# Patient Record
Sex: Male | Born: 1935 | Race: White | Hispanic: No | Marital: Married
Health system: Southern US, Community
[De-identification: ages and names within clinical notes are randomized; demographics above are authoritative.]

---

## 2006-11-05 ENCOUNTER — Encounter
Admission: RE | Admit: 2006-11-05 | Discharge: 2007-02-03 | Payer: Self-pay | Admitting: Physical Medicine & Rehabilitation

## 2006-11-10 ENCOUNTER — Ambulatory Visit: Payer: Self-pay | Admitting: Physical Medicine & Rehabilitation

## 2007-01-14 ENCOUNTER — Observation Stay (HOSPITAL_COMMUNITY): Admission: RE | Admit: 2007-01-14 | Discharge: 2007-01-15 | Payer: Self-pay | Admitting: Orthopaedic Surgery

## 2007-03-24 ENCOUNTER — Inpatient Hospital Stay (HOSPITAL_COMMUNITY): Admission: RE | Admit: 2007-03-24 | Discharge: 2007-03-27 | Payer: Self-pay | Admitting: Orthopaedic Surgery

## 2007-12-02 IMAGING — CR DG LUMBAR SPINE 2-3V
1 series · 1 of 1 positions shown · non-contrast
Comparison: None

CLINICAL DATA: L4-L5 spinal stenosis. Fusion revision.

LUMBAR SPINE - 2 VIEW

[view not recorded]
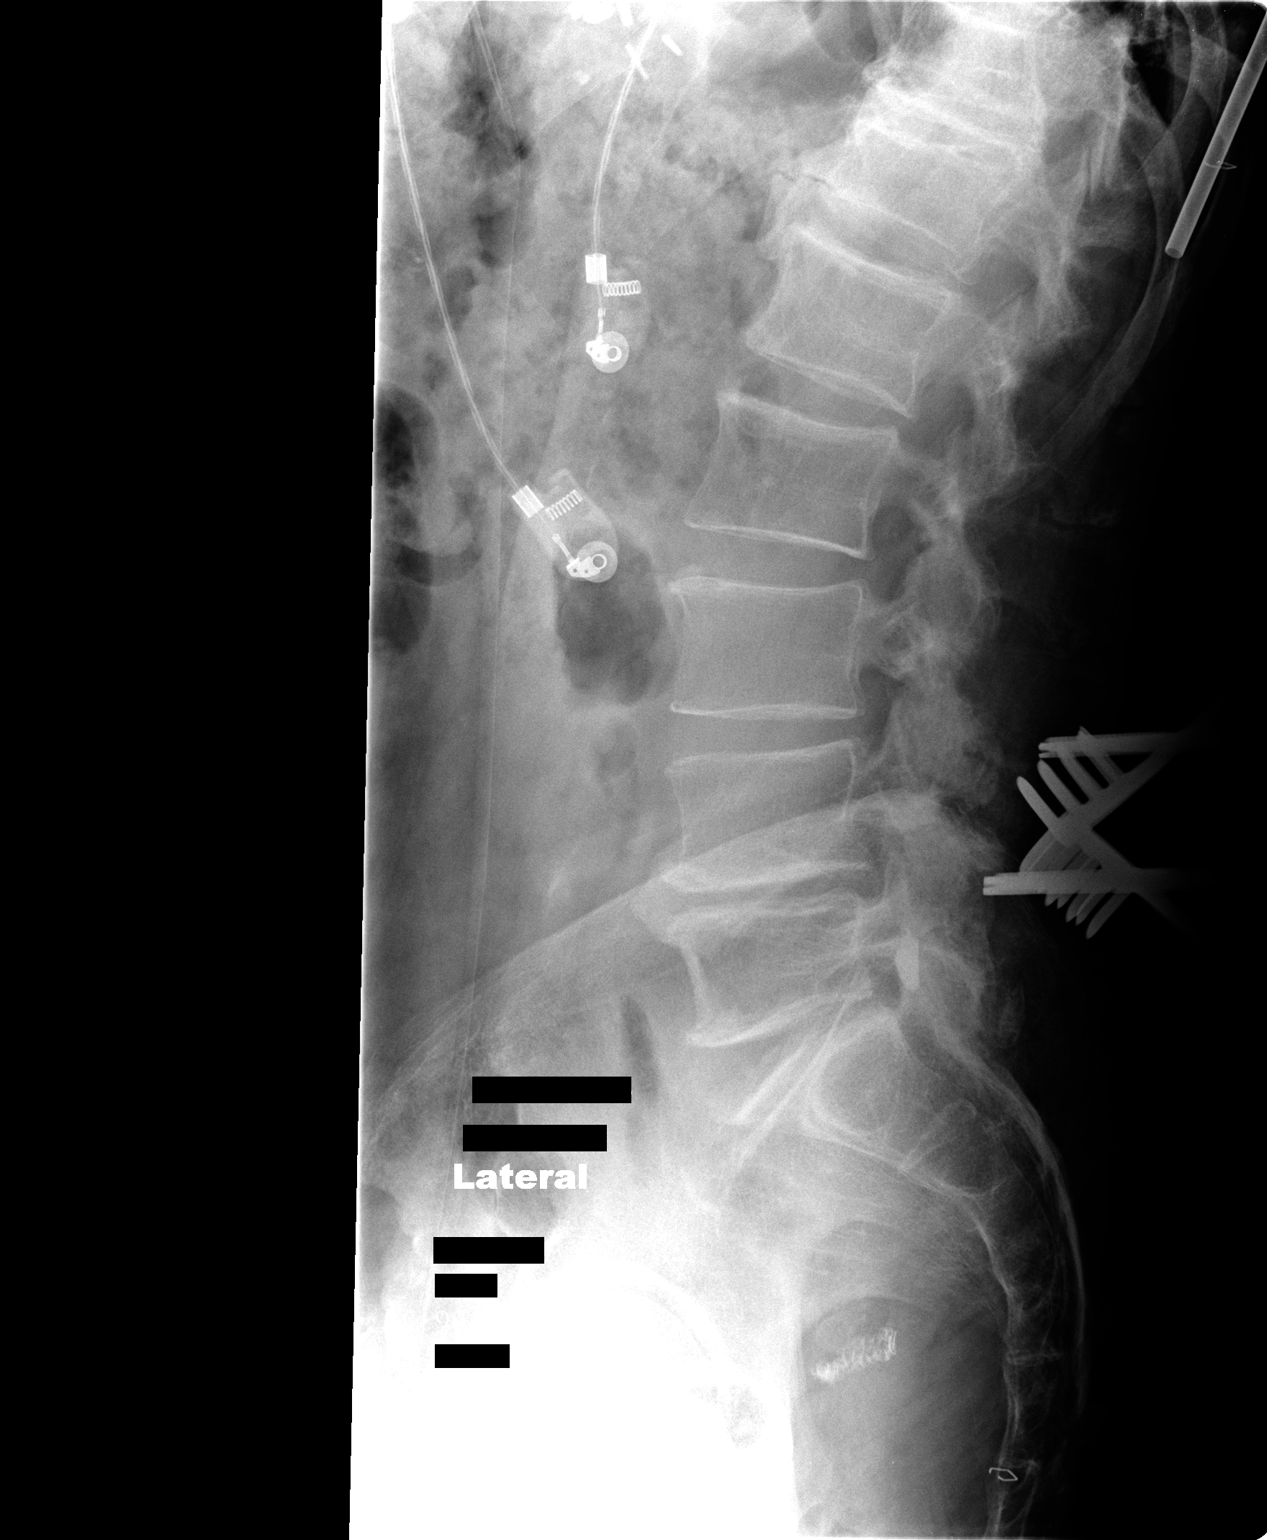

[1 of 1 positions shown; findings below may reference images not displayed]

FINDINGS: Two intraoperative views are submitted. The first, labeled 1566 hours
demonstrates surgical devices projecting posterior to the L4 and L5 levels.
Focus of increased density projecting about the posterior aspect of the L5-S1
level is of indeterminate etiology. Note is made of probable mild superior
endplate compression fracture T12, incompletely evaluated.

The second image, labeled film 2, at 1151 hours, demonstrates posterior fixation
of L4 and L5.

IMPRESSION

1. L4-L5 fixation intraoperative views as described.
2. Possible mild T12 compression fracture which is incompletely imaged.

## 2007-12-05 IMAGING — CR DG LUMBAR SPINE 2-3V
2 series · 2 of 2 positions shown · non-contrast
Comparison: 03/24/07

CLINICAL DATA: 84 year-old with spinal stenosis.
 LUMBAR SPINE ? 2 VIEW:

[view not recorded (1 of 2)]
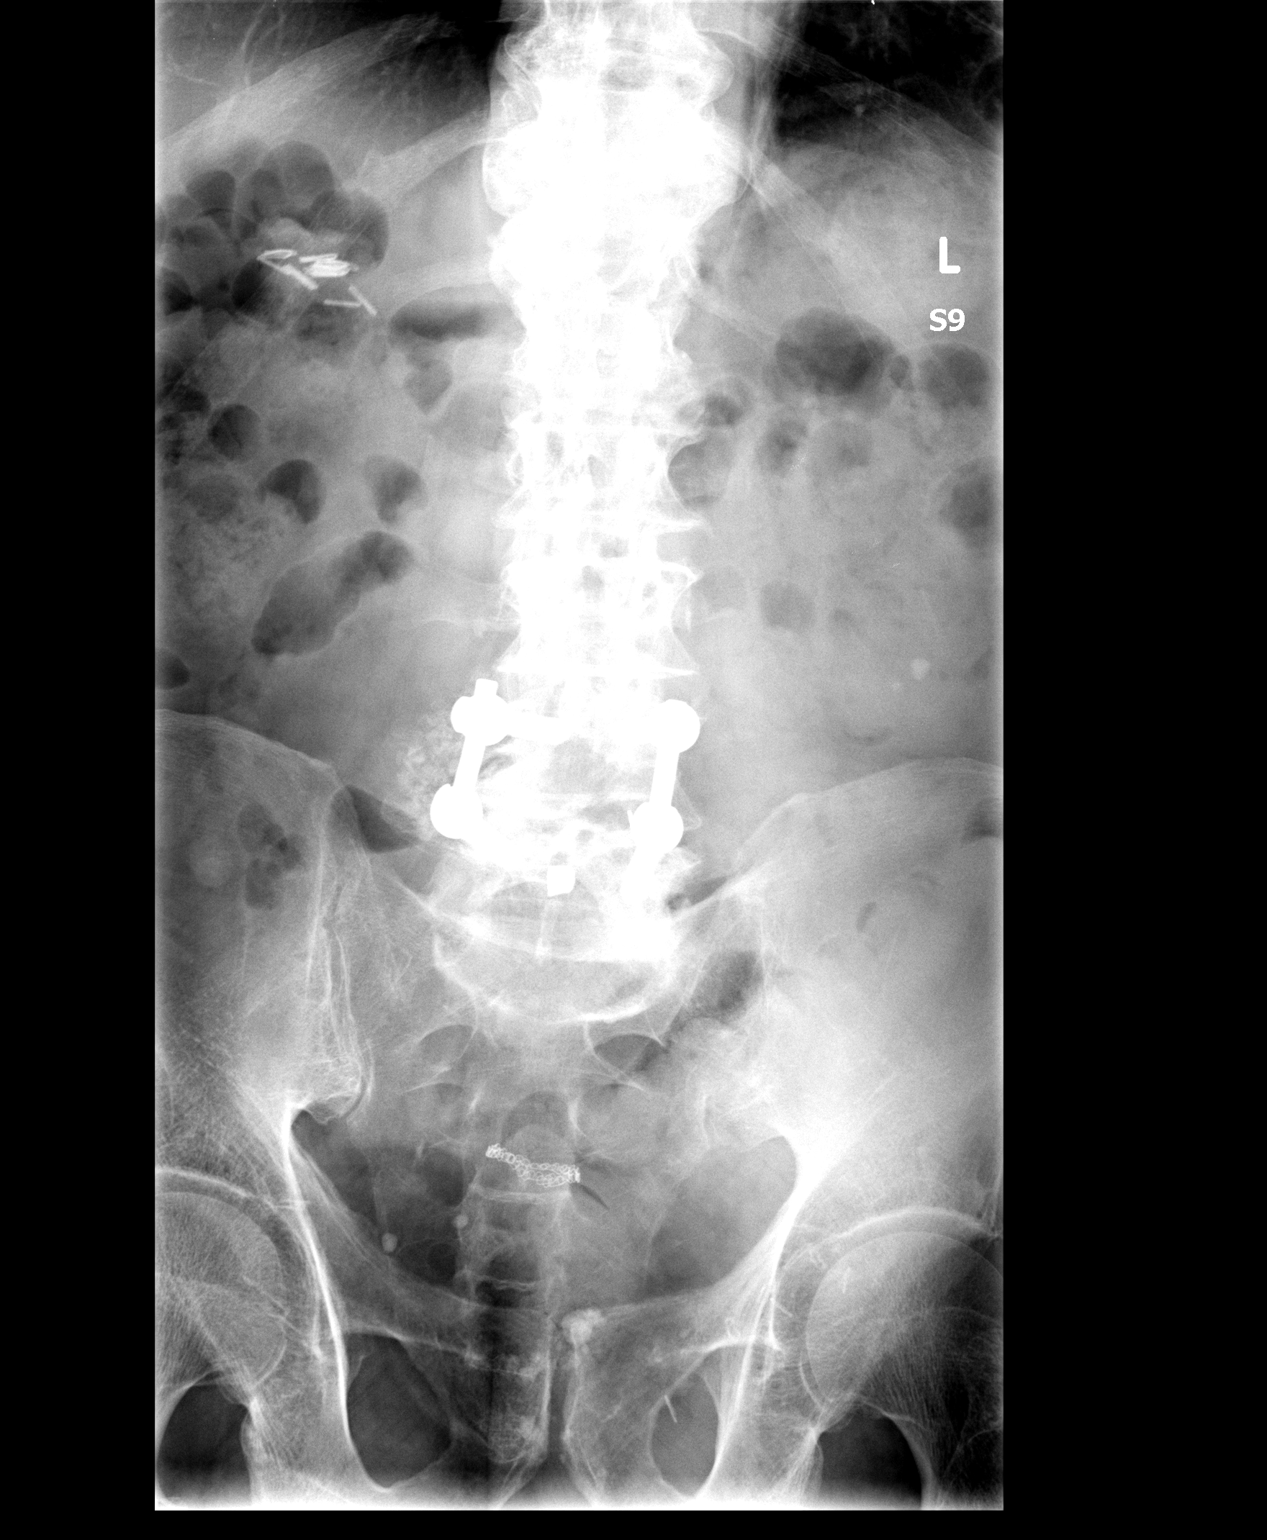

[view not recorded (2 of 2)]
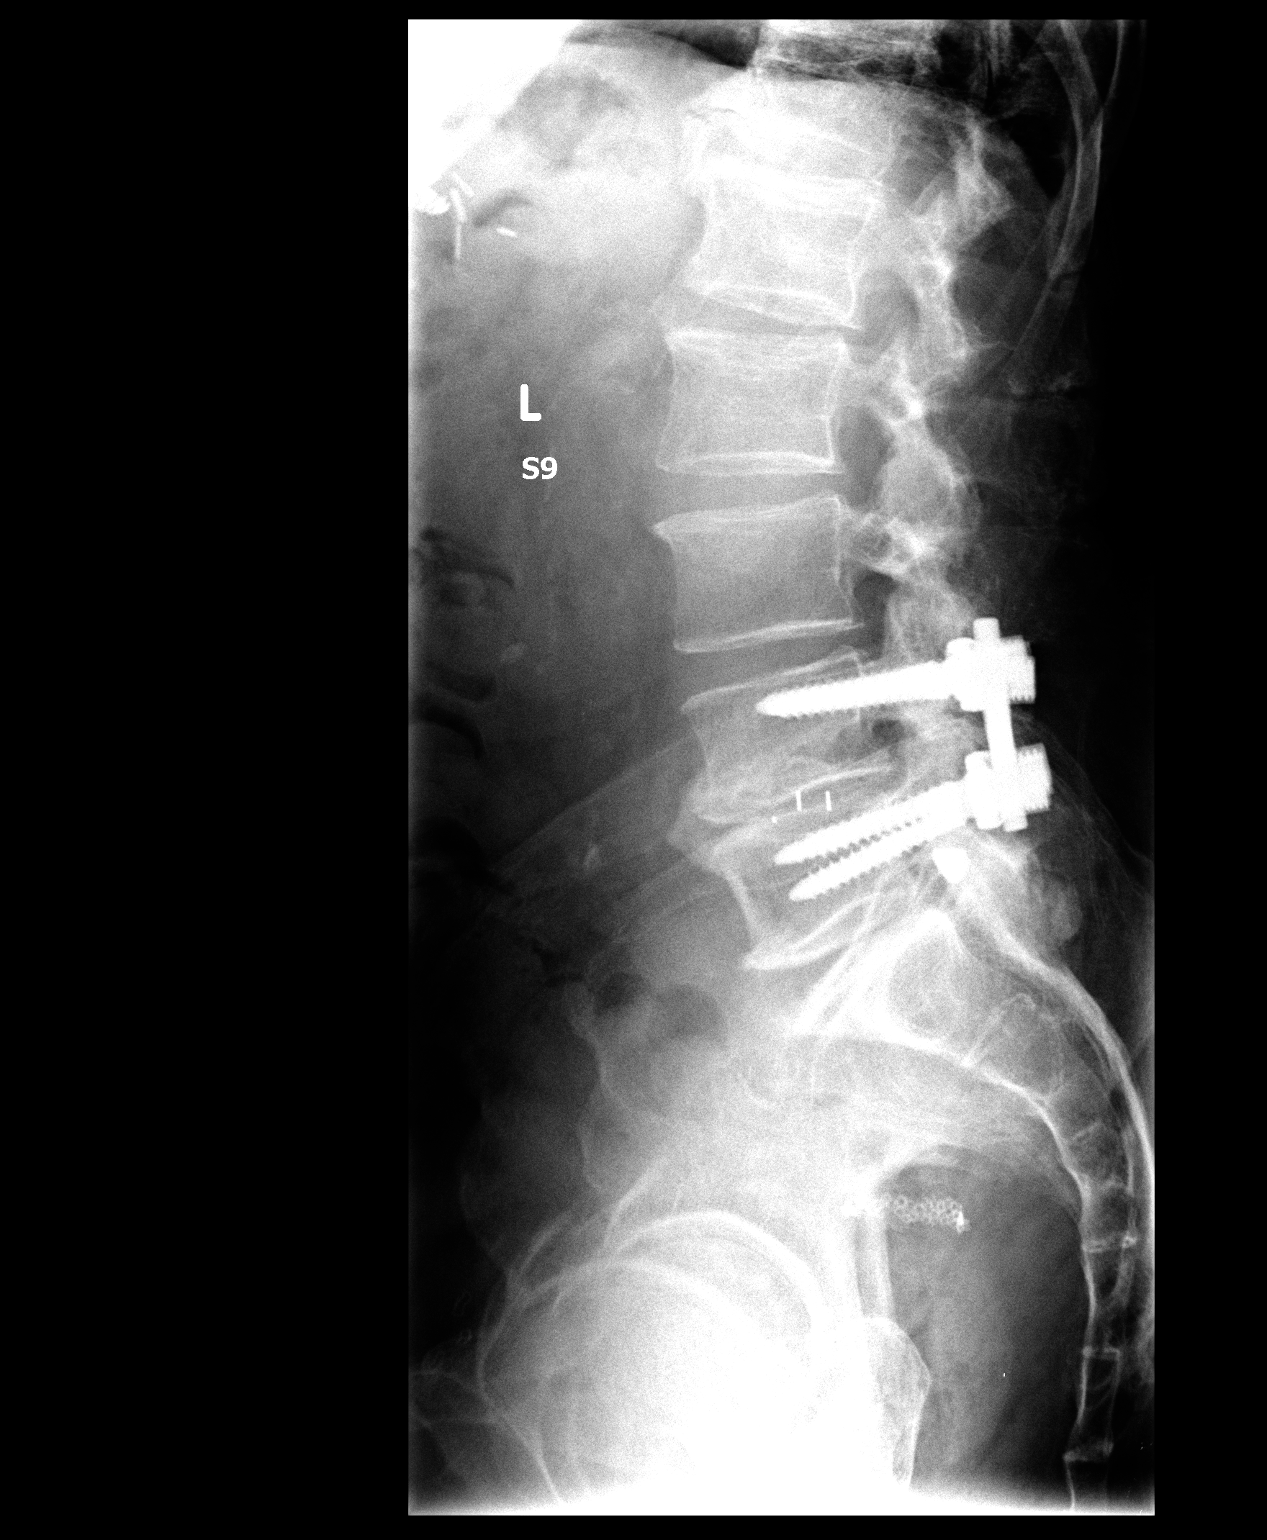

[2 of 2 positions shown; findings below may reference images not displayed]

FINDINGS: Pedicle screws and posterior rods are fusing L4-5 along with interbody bone space.  Persistent density in the spinal canal behind L5.
IMPRESSION: 1.  Stable position of the hardware and stable alignment of the lumbar spine.

## 2010-12-31 NOTE — Op Note (Signed)
NAME:  Gary Gibbs, LOUDERMILK NO.:  [id number]   MEDICAL RECORD NO.:  [id number]          PATIENT TYPE:  INP   LOCATION:  5021                         FACILITY:  [facility]   PHYSICIAN:  Sharolyn Douglas, M.D.        DATE OF BIRTH:  September 26, 1921   DATE OF PROCEDURE:  03/24/2007  DATE OF DISCHARGE:                               OPERATIVE REPORT   PREOPERATIVE DIAGNOSIS:  1. L4-5 degenerative spondylosis and disk disease with recurrent      chronic disk herniation.  2. Chronic back and left lower extremity pain.  3. Multilevel lumbar spondylosis.   POSTOPERATIVE DIAGNOSIS:  1. L4-5 degenerative spondylosis and disk disease with recurrent      chronic disk herniation.  2. Chronic back and left lower extremity pain.  3. Multilevel lumbar spondylosis.  4. Previous L5-S1 fusion.   PROCEDURE:  1. Exploration of L5-S1 posterior lateral fusion.  2. Revision L4-5 laminectomy with wide decompression of the thecal sac      and nerve roots bilaterally.  3. Transforaminal lumbar interbody fusion L4-5 with placement of 10-mm      PEEK cage.  4. Posterior spinal fusion L4-5.  5. Pedicle screw instrumentation L4-5 using the Abbott spine system.  6. Local autogenous bone graft supplemented with BMP.   SURGEON:  Sharolyn Douglas, MD.   ASSISTANT:  Aura Fey. Bobbe Medico.   ANESTHESIA:  General endotracheal.   ESTIMATED BLOOD LOSS:  300 mL.   COMPLICATIONS:  None.   SPONGE AND NEEDLE COUNT:  Correct.   INDICATIONS:  The patient is a pleasant 77 year old male with  progressively worsening back and left lower extremity pain.  He had a  history of previous lumbar surgery done many years ago including  laminectomy and fusion.  As mentioned, studies demonstrate degenerative  changes most pronounced at L4-5 with a left chronic appearing  posterolateral recurrent disk herniation.  He has failed other attempts  at conservative treatment and now elects for revision decompression and  fusion at L4-5.   Risks, benefits and alternatives were reviewed.  The  patient elected to proceed.   PROCEDURE:  After informed consent patient taken to the operating room.  He underwent general endotracheal anesthesia without difficulty given  prophylactic IV antibiotics.  Neuro monitoring was established in the  form of lower extremity EMGs and SSEP's.  He was carefully turned prone  onto the Wilson frame.  All bony prominences padded.  Face eyes  protected at all times.  Back prepped, draped usual sterile fashion.  Previous incision was utilized and extended several centimeters  proximally.  Dissection was carried sharply through the previous scar  tissue and a subperiosteal exposure was carried out to the tips of the  transverse process of L4-L5 bilaterally.  We found severe hypertrophy of  the facette joints.  In addition, there appeared to have been an in situ  fusion at L5-S1, primarily on the left side involving the facette joint  as well as the transverse processes of L5 and sacral ala.  This was  explored fully using the electrocautery and it  did appear that a solid  fusion had formed between L5-S1 on the left.  There was no visible  motion occurring at this segment.  We concluded that the L5-S1 segment  was fused.   At this point we turned our attention to performing revision laminectomy  at L4-5 the edges of the previous laminectomy were identified with  curettes.  The epidural fibrosis was carefully cleared using loupes and  headlight magnification.  Once the spinal canal could be entered the  laminectomy was widened and the lateral border of thecal sac was  identified bilaterally.  We then identified the L5 nerve roots.  On the  left side the L5 nerve root was scarred to the underlying disk and being  displaced dorsally by a ossified disk herniation.  Once we were  satisfied with the decompression, we proceeded with placement of pedicle  screw instrumentation L4 and L5 bilaterally.  Using  anatomic probing  technique, we could palpate the pedicles from within the spinal canal.  The pedicles were started with the awl.  We then used the pedicle probe  to cannulate the pedicle.  The pedicles were palpated.  There were no  breeches.  We placed 6.5 x 45 mm screws.  The bone quality was somewhat  soft, however the screw purchase was adequate.  We stimulated the  pedicle screws after they were placed in there were no deleterious  changes on the triggered EMGs.   We completed the posterior spinal fusion by decorticating the transverse  processes of L4 and L5 bilaterally and packing the local bone graft  obtained from the laminectomy into the lateral gutters.   At this point we elected to proceed the transforaminal lumbar interbody  fusion on the left side to further decompress the nerve root and improve  __________.  The remaining facette joint was osteotomized.  The exiting  transversing nerve roots were identified and free running EMGs were  monitored.  Transforaminal window was created and the disk space was  entered.  We then completed a radical diskectomy.  We used Epstein  curettes to push down the ossified disk herniation up underneath the  nerve root further decompressing the spinal canal.  The cartilaginous  endplates were scraped using curved curettes.  The disk space was  dilated up to 10 mm.  We packed the disk space with OP1 BMP putty along  with local bone graft from the laminectomy.  We then inserted a 10 mm  PEEK cage packed with the OP1 BMP into the interspace, tamped it  anteriorly and across the midline.  We then placed the 40 mm titanium  rods into the polyaxial screw heads.  Compression was applied before  shearing off the locking caps.  Intraoperative x-ray was taken which  showed good positioning the instrumentation at L4-5.  The deep Hemovac  drain was left.  Meticulous hemostasis was achieved and Gelfoam was left  over the exposed epidural space.  The  deep fascia closed with a running  #1 Vicryl suture.  Subcutaneous layer closed with 0-0 Vicryl and 2-0  Vicryl followed by a running 3-0 subcu Vicryl suture on the skin edges.  Dermabond was applied.  Sterile dressing placed.  The patient was turned  supine, extubated without difficulty and transferred to recovery in  stable condition.   It should be noted my assistant Orlin Hilding, PA was present throughout  the procedure.  She assisted me with the exposure, she helped using the  suction and Cobb elevators.  She then assisted me using loupes and  headlight magnification during the decompression, the instrumentation  and fusion.  She then assisted with wound closure.     Sharolyn Douglas, M.D.  Electronically Signed    MC/MEDQ  D:  03/24/2007  T:  03/24/2007  Job:  [id number]

## 2010-12-31 NOTE — Op Note (Signed)
NAME:  Gary Gibbs, Gary Gibbs NO.:  [id number]   MEDICAL RECORD NO.:  [id number]          PATIENT TYPE:  INP   LOCATION:  2899                         FACILITY:  [facility]   PHYSICIAN:  Sharolyn Douglas, M.D.        DATE OF BIRTH:  [birth year]   DATE OF PROCEDURE:  01/14/2007  DATE OF DISCHARGE:                               OPERATIVE REPORT   DIAGNOSES:  1. Recurrent lumbar stenosis L4-5.  2. Uncontrolled hypertension   The procedure was aborted.   The patient was taken back to the operating room and the  anesthesiologist attempted to obtain a blood pressure on his cuff.  The  machine could read a blood pressure and then an A-line was placed.  His  A-line red extremely high blood pressures ranging from 240 to 290/120.  It was felt that it would not be safe to move forward with his  operation.  The patient is going to be taken back to the recovery room.  A medicine consultation will be obtained to determine if he needs to be  admitted to the medical service for control of his hypertension.  It  should be noted that the patient does have a history of hypertension and  is currently on medications.  Once his hypertension is controlled, we  will consider rescheduling him.      Sharolyn Douglas, M.D.  Electronically Signed     MC/MEDQ  D:  01/14/2007  T:  01/14/2007  Job:  [id number]

## 2010-12-31 NOTE — H&P (Signed)
NAME:  Gary Gibbs, Gary Gibbs NO.:  [id number]   MEDICAL RECORD NO.:  [id number]          PATIENT TYPE:  INP   LOCATION:  2550                         FACILITY:  [facility]   PHYSICIAN:  Beckey Rutter, MD  DATE OF BIRTH:  06-25-1922   DATE OF ADMISSION:  01/14/2007  DATE OF DISCHARGE:                              HISTORY & PHYSICAL   PRIMARY CARE PHYSICIAN:  Unassigned to [facility]   ORTHOPEDIC SURGEON:  Dr. Sharolyn Douglas   CHIEF COMPLAINT:  Dizziness.   HISTORY OF PRESENT ILLNESS:  This is an 77 year old male with past  medical history significant for chronic back pain with L4-5  radiculopathy, history of CVA in 2005, history of colon cancer and  congenital coronary artery disease.  Patient was going for back surgery  today, but before starting the anesthesia blood pressure was noticed to  be very high, reading was 270/120.  At that time, patient was feeling a  little dizzy and some sweating was noticed by the anesthesia staff.  The  surgery was postponed and the patient was kept in the PACU with 20 mg of  labetalol was pushed.  During the stay in PACU, his blood pressure  trended down, now his blood pressure is 113/83.  Patient had an A-line  placed when his blood pressure was high on the operation table.  Upon  questioning the patient during the history taken, he denied having an  experience with high blood pressure like this during or before surgery  and he had some surgeries done in the past.  He denied feeling chest  pain or any kind of pain and he just admitted to have dizziness and some  lightheadedness, but no nausea or vomiting.   PAST MEDICAL HISTORY:  Significant for:  1. CVA 2005.  2. History of colon cancer.  3. Hypertension.   SURGICAL HISTORY:  He is status post cholecystectomy and he has history  of ruptured disc about 25 years ago; the cystoscopy was done in 2006;  the colon cancer was diagnosed 1985 and a broken shoulder was diagnosed  in 2006.   FAMILY HISTORY:  Significant for mother with diabetes, cancer on the  father's side of the family.   He is married, retired Paediatric nurse, denied alcohol or tobacco or illicit drug  use.   ALLERGIES:  HE IS ALLERGIC TO ASPIRIN.   MEDICATIONS:  1. Plavix 75 mg daily.  2. Lisinopril 40 mg daily.  3. Toprol 100 mg daily.  4. Proscar 5 mg daily.  5. Naproxen one tab daily at bed time.  6. Advil 300 mg p.r.n. as needed.   REVIEW OF SYSTEMS:  Review of systems as per HPI.   PHYSICAL EXAMINATION:  GENERAL:  He is alert, oriented x3.  VITAL SIGNS:  His blood pressure now is 213/83, pulse 65.  HEAD:  Atraumatic, normocephalic.  EYES:  PERRL.  LUNGS:  Bilateral fair air entry.  PRECORDIUM:  First and second heart sounds.  ABDOMEN:  Soft, nontender.  Bowel sounds present.  EXTREMITIES:  No lower extremity edema.  SKIN:  No significant hypo or  hyperpigmentation.  NEUROLOGIC:  Alert, oriented x3.   LABS AND X-RAY:  EKG done today showing sinus bradycardia with rate of  57, some evidence of LVH, no significant ST/T wave change from previous  EKG done on Jan 12, 2007 with preop labs.   Lab tests done on Jan 12, 2007 two days ago showing UA essentially  negative, sodium 141, potassium 4.3, chloride 105, CO2 29, glucose 94,  BUN 15, creatinine 1.19 with estimated GFR more than 60, AST 21, ALT 18,  total protein 6.3, alk phos 64, total bilirubin 1.1, PTT 30, PT 13.2,  INR 1.0, white blood count 4.5, hemoglobin 16.5, hematocrit 48.2,  platelet 158.   Chest x-ray done on Jan 12, 2007, impression reading low volumes,  bibasilar atelectasis.   ASSESSMENT:  This is an 77 year old male with past medical history  significant for history of colon cancer, CVA, hypertension, admitted  today for hypertensive emergency/urgency.   PLAN:  1. Patient will be admitted for further assessment and management.  2. Patient will be admitted to stepdown unit.  3. We will give anxiolytic medication.  It seems the  elevated blood      pressure has something to do with anxiety; he looks a little      anxious and he is diaphoretic.  Patient's blood pressure now      dropped to 213/83 from 270/120.  The drop is more than 15% of the      initial blood pressure.  For that, I am not going to start      antihypertensive medication with drip, rather we will consider the      anxiolytic medication and I will follow up with monitoring his      blood pressure.  Will rule acute coronary syndromes by three      cardiac enzymes, EKG is not showing significant change suggestive      of ST elevation.  PATIENT IS ALLERGIC TO ASPIRIN.  We will continue      on Plavix.  He has gotten a dose of beta-blocker in the form of      labetalol.  We will continue with Toprol and will hold lisinopril      for now; hence, the drop of the blood pressure is more than 15% as      discussed above.  I will keep the A-line for now during his      stepdown stay.      Beckey Rutter, MD  Electronically Signed     EME/MEDQ  D:  01/14/2007  T:  01/14/2007  Job:  [id number]

## 2011-01-03 NOTE — Procedures (Signed)
NAME:  Gary Gibbs, Gary Gibbs NO.:  [id number]   MEDICAL RECORD NO.:  [id number]          PATIENT TYPE:  REC   LOCATION:  TPC                          FACILITY:  [facility]   PHYSICIAN:  Erick Colace, M.D.DATE OF BIRTH:  01-05-1922   DATE OF PROCEDURE:  11/10/2006  DATE OF DISCHARGE:                               OPERATIVE REPORT   PROCEDURE:  Left L5 and left S1 transforaminal epidural steroid  injection under fluoroscopic guidance.   INDICATIONS:  Left lower extremity pain in the heel as well as left leg  and foot.  He also has a preexisting foot drop including the EHL muscle  on the left side.   The patient has been off his Plavix for over 5 days.   He is not taking any antibiotics.   Prior stroke affecting right-sided in 2005.   Informed consent was obtained after the describing risks and benefits of  the procedure to the patient.  These include bleeding, bruising,  infection, loss of bowel and bladder function, temporary or permanent  paralysis.  He elects to proceed and has given written consent.   The patient placed prone on fluoroscopy table, Betadine prep and sterile  drape.  A 25-gauge inch and half needle was used to anesthetize skin and  subcu tissue with 1% lidocaine x2 mL in each of two spots.  Then a 22  gauge 3-1/2 inch spinal needle was passed first into the left S1  foramen, AP, lateral and oblique imaging utilized. Omnipaque 180 x0.5 mL  demonstrated good foraminal out flow as well as S1 nerve root spread and  epidural spread.  Then a solution containing 1 mL of 10 mg/mL  dexamethasone and 1 mL of 1% lidocaine was injected.  Then this  procedure was repeated at L5, targeting subcuticular area on L5, AP,  lateral and oblique images utilized, Omnipaque 180 x0.5 mL demonstrated  no intravascular uptake.  Then a solution containing 1 mL of 10 mg/mL  dexamethasone and 1 mL of 1% on lidocaine were injected.  Pre injection  pain level 5/10.  Post  injection pain level 1-2/10.  Pre and post  injection vitals stable.  Post injection instructions given.  He will  follow up Dr. Noel Gerold in 1-2 weeks to assess efficacy      Erick Colace, M.D.  Electronically Signed     AEK/MEDQ  D:  11/10/2006 14:09:05  T:  11/10/2006 14:31:28  Job:  [id number]   cc:   Roney Marion, M.D.  [place], [place]

## 2011-01-03 NOTE — Discharge Summary (Signed)
NAME:  Gary Gibbs, Gary Gibbs NO.:  [id number]   MEDICAL RECORD NO.:  [id number]          PATIENT TYPE:  INP   LOCATION:  5021                         FACILITY:  [facility]   PHYSICIAN:  Sharolyn Douglas, M.D.        DATE OF BIRTH:  01-Nov-1921   DATE OF ADMISSION:  03/24/2007  DATE OF DISCHARGE:  03/27/2007                               DISCHARGE SUMMARY   ADMISSION DIAGNOSES:  1. Lumbar degenerative disk disease and spondylosis.  2. History of transient ischemic attack.  3. History of hypertension.  4. History of esophageal reflux.  5. History of colon malignancy.   OPERATIONS/PROCEDURES:  The patient underwent revision lumbar  laminectomy L4-L5 and posterior spinal fusion, pedicle screws, T lift L4-  L5.   BRIEF HISTORY:  The patient is an 77 year old gentleman who had  continued back pain unrelieved with conservative care and elected to  proceed with surgical intervention.   HOSPITAL COURSE:  The patient was admitted on March 24, 2007 and  underwent revision lumbar laminectomy L4-L5 and posterior spinal fusion  with pedicle screws, T lift L4-L5 under general anesthesia, tolerated  this procedure well.  Stabilized to the recovery room, placed on the  floor. For discharge planning, home health, physical therapy, durable  amount of equipment to be arranged.  Vancomycin times 24 hours, dose per  pharmacy, TED hose and sequential compressive devices, DVT prophylaxis,  out of bed with physical therapy on the morning of March 25, 2007.  LSO  for ambulation, advanced diet slowly.  PCA morphine pump, Vicodin and  Robaxin for pain control.  Restart him on his home medications.  Second  day postoperatively the patient was doing well, pain was well  controlled.  Out of bed with physical therapy.  Heel pads for prevention  of bolsters.  Advance diet.  Foley catheter was discontinued.  PCA was  weaned off to p.o. pain meds and discontinued oxygen.  The third day  postoperatively the  patient was ready for discharge home, eating well,  voiding well, ambulating safely.   Hospital labs, hemoglobin 12.7, hematocrit 36.3, PT 12.8, PTT 33, sodium  135, potassium 4.2. Other chemistries can be obtained from the permanent  hospital record.   DISCHARGE CONDITION:  Stable and improved.   DISCHARGE INSTRUCTIONS:  Discharge home on March 27, 2007.   DISCHARGE MEDICATIONS:  1. Vicodin.  2. Robaxin.  3. Calcium.  4. Colace.  5. Multivitamin.  6. Continue on home medications.   He may shower.  Dermabond in place.  Wound is benign.  No erythema,  drainage or signs of infection.  Follow up with Dr. Noel Gerold in 2 weeks.  No lifting greater than 5 pounds for 12 weeks.  No driving for 2 weeks.  Slowly increase activity, home health, physical therapy. Contact our  office prior to followup with any questions or concerns.      Aura Fey Bobbe Medico.      Sharolyn Douglas, M.D.     SCI/MEDQ  D:  05/20/2007  T:  05/20/2007  Job:  [id number]

## 2011-06-02 LAB — HEMOGLOBIN AND HEMATOCRIT, BLOOD
HCT: 36.3 — ABNORMAL LOW
HCT: 38.6 — ABNORMAL LOW
Hemoglobin: 12.7 — ABNORMAL LOW
Hemoglobin: 13.5

## 2011-06-02 LAB — BASIC METABOLIC PANEL
CO2: 22
Chloride: 108
Chloride: 109
Creatinine, Ser: 1.17
GFR calc Af Amer: >60
GFR calc Af Amer: >60
GFR calc non Af Amer: >60
Potassium: 4.2
Potassium: 4.4
Sodium: 135
Sodium: 135

## 2011-06-02 LAB — CBC
Hemoglobin: 15.3
MCHC: 34.3
MCV: 94.3
RBC: 4.72

## 2011-06-02 LAB — DIFFERENTIAL
Basophils Absolute: 0
Eosinophils Absolute: 0.1
Lymphs Abs: 1
Neutrophils Relative %: 68

## 2011-06-02 LAB — COMPREHENSIVE METABOLIC PANEL
ALT: 20
CO2: 28
Calcium: 8.9
Creatinine, Ser: 1.26
GFR calc non Af Amer: 55 — ABNORMAL LOW
Glucose, Bld: 101 — ABNORMAL HIGH
Total Bilirubin: 0.7

## 2011-06-02 LAB — PROTIME-INR
INR: 1
Prothrombin Time: 12.8

## 2011-06-02 LAB — URINE CULTURE

## 2011-09-18 DIAGNOSIS — H5200 Hypermetropia, unspecified eye: Secondary | ICD-10-CM | POA: Diagnosis not present

## 2011-09-18 DIAGNOSIS — H43399 Other vitreous opacities, unspecified eye: Secondary | ICD-10-CM | POA: Diagnosis not present

## 2011-09-18 DIAGNOSIS — H524 Presbyopia: Secondary | ICD-10-CM | POA: Diagnosis not present

## 2011-09-18 DIAGNOSIS — H2589 Other age-related cataract: Secondary | ICD-10-CM | POA: Diagnosis not present

## 2011-10-02 DIAGNOSIS — I69959 Hemiplegia and hemiparesis following unspecified cerebrovascular disease affecting unspecified side: Secondary | ICD-10-CM | POA: Diagnosis not present

## 2011-10-02 DIAGNOSIS — R300 Dysuria: Secondary | ICD-10-CM | POA: Diagnosis not present

## 2011-10-02 DIAGNOSIS — I10 Essential (primary) hypertension: Secondary | ICD-10-CM | POA: Diagnosis not present

## 2011-10-02 DIAGNOSIS — M109 Gout, unspecified: Secondary | ICD-10-CM | POA: Diagnosis not present

## 2011-10-14 DIAGNOSIS — L219 Seborrheic dermatitis, unspecified: Secondary | ICD-10-CM | POA: Diagnosis not present

## 2011-10-14 DIAGNOSIS — L570 Actinic keratosis: Secondary | ICD-10-CM | POA: Diagnosis not present

## 2011-11-18 DIAGNOSIS — M1710 Unilateral primary osteoarthritis, unspecified knee: Secondary | ICD-10-CM | POA: Diagnosis not present

## 2011-11-18 DIAGNOSIS — M25569 Pain in unspecified knee: Secondary | ICD-10-CM | POA: Diagnosis not present

## 2012-03-04 DIAGNOSIS — M1710 Unilateral primary osteoarthritis, unspecified knee: Secondary | ICD-10-CM | POA: Diagnosis not present

## 2012-03-10 DIAGNOSIS — M25569 Pain in unspecified knee: Secondary | ICD-10-CM | POA: Diagnosis not present

## 2012-03-15 DIAGNOSIS — M25569 Pain in unspecified knee: Secondary | ICD-10-CM | POA: Diagnosis not present

## 2012-03-18 DIAGNOSIS — M25569 Pain in unspecified knee: Secondary | ICD-10-CM | POA: Diagnosis not present

## 2012-03-19 DIAGNOSIS — M1710 Unilateral primary osteoarthritis, unspecified knee: Secondary | ICD-10-CM | POA: Diagnosis not present

## 2012-03-22 DIAGNOSIS — M25569 Pain in unspecified knee: Secondary | ICD-10-CM | POA: Diagnosis not present

## 2012-03-26 DIAGNOSIS — M1710 Unilateral primary osteoarthritis, unspecified knee: Secondary | ICD-10-CM | POA: Diagnosis not present

## 2012-04-02 DIAGNOSIS — M1710 Unilateral primary osteoarthritis, unspecified knee: Secondary | ICD-10-CM | POA: Diagnosis not present

## 2012-05-24 DIAGNOSIS — I69959 Hemiplegia and hemiparesis following unspecified cerebrovascular disease affecting unspecified side: Secondary | ICD-10-CM | POA: Diagnosis not present

## 2012-05-24 DIAGNOSIS — I10 Essential (primary) hypertension: Secondary | ICD-10-CM | POA: Diagnosis not present

## 2012-05-24 DIAGNOSIS — E7800 Pure hypercholesterolemia, unspecified: Secondary | ICD-10-CM | POA: Diagnosis not present

## 2012-05-24 DIAGNOSIS — Z23 Encounter for immunization: Secondary | ICD-10-CM | POA: Diagnosis not present

## 2012-07-06 DIAGNOSIS — R609 Edema, unspecified: Secondary | ICD-10-CM | POA: Diagnosis not present

## 2012-07-06 DIAGNOSIS — I8310 Varicose veins of unspecified lower extremity with inflammation: Secondary | ICD-10-CM | POA: Diagnosis not present

## 2012-07-06 DIAGNOSIS — L570 Actinic keratosis: Secondary | ICD-10-CM | POA: Diagnosis not present

## 2012-07-06 DIAGNOSIS — L821 Other seborrheic keratosis: Secondary | ICD-10-CM | POA: Diagnosis not present

## 2012-07-06 DIAGNOSIS — L97909 Non-pressure chronic ulcer of unspecified part of unspecified lower leg with unspecified severity: Secondary | ICD-10-CM | POA: Diagnosis not present

## 2012-07-12 DIAGNOSIS — I8310 Varicose veins of unspecified lower extremity with inflammation: Secondary | ICD-10-CM | POA: Diagnosis not present

## 2012-07-12 DIAGNOSIS — R609 Edema, unspecified: Secondary | ICD-10-CM | POA: Diagnosis not present

## 2012-08-02 DIAGNOSIS — R609 Edema, unspecified: Secondary | ICD-10-CM | POA: Diagnosis not present

## 2012-08-02 DIAGNOSIS — I8310 Varicose veins of unspecified lower extremity with inflammation: Secondary | ICD-10-CM | POA: Diagnosis not present

## 2012-11-01 DIAGNOSIS — H5200 Hypermetropia, unspecified eye: Secondary | ICD-10-CM | POA: Diagnosis not present

## 2012-11-01 DIAGNOSIS — H43399 Other vitreous opacities, unspecified eye: Secondary | ICD-10-CM | POA: Diagnosis not present

## 2012-11-01 DIAGNOSIS — H52229 Regular astigmatism, unspecified eye: Secondary | ICD-10-CM | POA: Diagnosis not present

## 2012-11-01 DIAGNOSIS — H2589 Other age-related cataract: Secondary | ICD-10-CM | POA: Diagnosis not present

## 2013-01-11 DIAGNOSIS — I69959 Hemiplegia and hemiparesis following unspecified cerebrovascular disease affecting unspecified side: Secondary | ICD-10-CM | POA: Diagnosis not present

## 2013-01-11 DIAGNOSIS — N401 Enlarged prostate with lower urinary tract symptoms: Secondary | ICD-10-CM | POA: Diagnosis not present

## 2013-01-11 DIAGNOSIS — Z79899 Other long term (current) drug therapy: Secondary | ICD-10-CM | POA: Diagnosis not present

## 2013-01-11 DIAGNOSIS — N139 Obstructive and reflux uropathy, unspecified: Secondary | ICD-10-CM | POA: Diagnosis not present

## 2013-01-11 DIAGNOSIS — I10 Essential (primary) hypertension: Secondary | ICD-10-CM | POA: Diagnosis not present

## 2013-02-02 DIAGNOSIS — R609 Edema, unspecified: Secondary | ICD-10-CM | POA: Diagnosis not present

## 2013-02-02 DIAGNOSIS — I8310 Varicose veins of unspecified lower extremity with inflammation: Secondary | ICD-10-CM | POA: Diagnosis not present

## 2013-02-02 DIAGNOSIS — D235 Other benign neoplasm of skin of trunk: Secondary | ICD-10-CM | POA: Diagnosis not present

## 2013-02-07 DIAGNOSIS — I8310 Varicose veins of unspecified lower extremity with inflammation: Secondary | ICD-10-CM | POA: Diagnosis not present

## 2013-02-16 DIAGNOSIS — I8310 Varicose veins of unspecified lower extremity with inflammation: Secondary | ICD-10-CM | POA: Diagnosis not present

## 2013-05-04 DIAGNOSIS — I8310 Varicose veins of unspecified lower extremity with inflammation: Secondary | ICD-10-CM | POA: Diagnosis not present

## 2013-05-04 DIAGNOSIS — R609 Edema, unspecified: Secondary | ICD-10-CM | POA: Diagnosis not present

## 2013-05-04 DIAGNOSIS — L02219 Cutaneous abscess of trunk, unspecified: Secondary | ICD-10-CM | POA: Diagnosis not present

## 2013-06-06 DIAGNOSIS — Z23 Encounter for immunization: Secondary | ICD-10-CM | POA: Diagnosis not present

## 2013-09-15 DIAGNOSIS — M109 Gout, unspecified: Secondary | ICD-10-CM | POA: Diagnosis not present

## 2013-09-15 DIAGNOSIS — G589 Mononeuropathy, unspecified: Secondary | ICD-10-CM | POA: Diagnosis not present

## 2013-09-15 DIAGNOSIS — I10 Essential (primary) hypertension: Secondary | ICD-10-CM | POA: Diagnosis not present

## 2013-11-01 DIAGNOSIS — C44529 Squamous cell carcinoma of skin of other part of trunk: Secondary | ICD-10-CM | POA: Diagnosis not present

## 2013-11-01 DIAGNOSIS — D1801 Hemangioma of skin and subcutaneous tissue: Secondary | ICD-10-CM | POA: Diagnosis not present

## 2013-11-01 DIAGNOSIS — L570 Actinic keratosis: Secondary | ICD-10-CM | POA: Diagnosis not present

## 2013-11-01 DIAGNOSIS — D235 Other benign neoplasm of skin of trunk: Secondary | ICD-10-CM | POA: Diagnosis not present

## 2013-11-01 DIAGNOSIS — L980 Pyogenic granuloma: Secondary | ICD-10-CM | POA: Diagnosis not present

## 2014-01-04 DIAGNOSIS — H2589 Other age-related cataract: Secondary | ICD-10-CM | POA: Diagnosis not present

## 2014-01-04 DIAGNOSIS — H524 Presbyopia: Secondary | ICD-10-CM | POA: Diagnosis not present

## 2014-01-04 DIAGNOSIS — H5200 Hypermetropia, unspecified eye: Secondary | ICD-10-CM | POA: Diagnosis not present

## 2014-01-04 DIAGNOSIS — H52229 Regular astigmatism, unspecified eye: Secondary | ICD-10-CM | POA: Diagnosis not present

## 2014-05-15 DIAGNOSIS — L570 Actinic keratosis: Secondary | ICD-10-CM | POA: Diagnosis not present

## 2014-05-15 DIAGNOSIS — L821 Other seborrheic keratosis: Secondary | ICD-10-CM | POA: Diagnosis not present

## 2014-05-15 DIAGNOSIS — L578 Other skin changes due to chronic exposure to nonionizing radiation: Secondary | ICD-10-CM | POA: Diagnosis not present

## 2014-05-15 DIAGNOSIS — L820 Inflamed seborrheic keratosis: Secondary | ICD-10-CM | POA: Diagnosis not present

## 2014-05-30 DIAGNOSIS — Z23 Encounter for immunization: Secondary | ICD-10-CM | POA: Diagnosis not present

## 2014-07-03 DIAGNOSIS — L570 Actinic keratosis: Secondary | ICD-10-CM | POA: Diagnosis not present

## 2014-07-03 DIAGNOSIS — C44629 Squamous cell carcinoma of skin of left upper limb, including shoulder: Secondary | ICD-10-CM | POA: Diagnosis not present

## 2014-08-07 DIAGNOSIS — I10 Essential (primary) hypertension: Secondary | ICD-10-CM | POA: Diagnosis not present

## 2014-08-07 DIAGNOSIS — Z125 Encounter for screening for malignant neoplasm of prostate: Secondary | ICD-10-CM | POA: Diagnosis not present

## 2014-08-07 DIAGNOSIS — Z79899 Other long term (current) drug therapy: Secondary | ICD-10-CM | POA: Diagnosis not present

## 2014-08-07 DIAGNOSIS — Z23 Encounter for immunization: Secondary | ICD-10-CM | POA: Diagnosis not present

## 2014-08-07 DIAGNOSIS — Z0000 Encounter for general adult medical examination without abnormal findings: Secondary | ICD-10-CM | POA: Diagnosis not present

## 2014-08-07 DIAGNOSIS — I69359 Hemiplegia and hemiparesis following cerebral infarction affecting unspecified side: Secondary | ICD-10-CM | POA: Diagnosis not present

## 2014-08-07 DIAGNOSIS — M109 Gout, unspecified: Secondary | ICD-10-CM | POA: Diagnosis not present

## 2014-08-07 DIAGNOSIS — N401 Enlarged prostate with lower urinary tract symptoms: Secondary | ICD-10-CM | POA: Diagnosis not present

## 2014-08-07 DIAGNOSIS — E780 Pure hypercholesterolemia: Secondary | ICD-10-CM | POA: Diagnosis not present

## 2014-08-07 DIAGNOSIS — N138 Other obstructive and reflux uropathy: Secondary | ICD-10-CM | POA: Diagnosis not present

## 2014-10-24 DIAGNOSIS — K6289 Other specified diseases of anus and rectum: Secondary | ICD-10-CM | POA: Diagnosis not present

## 2014-10-24 DIAGNOSIS — K5900 Constipation, unspecified: Secondary | ICD-10-CM | POA: Diagnosis not present

## 2015-01-31 DIAGNOSIS — L821 Other seborrheic keratosis: Secondary | ICD-10-CM | POA: Diagnosis not present

## 2015-01-31 DIAGNOSIS — L578 Other skin changes due to chronic exposure to nonionizing radiation: Secondary | ICD-10-CM | POA: Diagnosis not present

## 2015-01-31 DIAGNOSIS — C44222 Squamous cell carcinoma of skin of right ear and external auricular canal: Secondary | ICD-10-CM | POA: Diagnosis not present

## 2015-01-31 DIAGNOSIS — L570 Actinic keratosis: Secondary | ICD-10-CM | POA: Diagnosis not present

## 2015-02-13 DIAGNOSIS — H5203 Hypermetropia, bilateral: Secondary | ICD-10-CM | POA: Diagnosis not present

## 2015-02-13 DIAGNOSIS — H35371 Puckering of macula, right eye: Secondary | ICD-10-CM | POA: Diagnosis not present

## 2015-02-13 DIAGNOSIS — H524 Presbyopia: Secondary | ICD-10-CM | POA: Diagnosis not present

## 2015-02-13 DIAGNOSIS — H52223 Regular astigmatism, bilateral: Secondary | ICD-10-CM | POA: Diagnosis not present

## 2015-02-13 DIAGNOSIS — H25813 Combined forms of age-related cataract, bilateral: Secondary | ICD-10-CM | POA: Diagnosis not present

## 2015-02-13 DIAGNOSIS — I10 Essential (primary) hypertension: Secondary | ICD-10-CM | POA: Diagnosis not present

## 2015-02-13 DIAGNOSIS — H43313 Vitreous membranes and strands, bilateral: Secondary | ICD-10-CM | POA: Diagnosis not present

## 2015-02-13 DIAGNOSIS — H43813 Vitreous degeneration, bilateral: Secondary | ICD-10-CM | POA: Diagnosis not present

## 2015-03-20 DIAGNOSIS — I10 Essential (primary) hypertension: Secondary | ICD-10-CM | POA: Diagnosis not present

## 2015-03-20 DIAGNOSIS — G629 Polyneuropathy, unspecified: Secondary | ICD-10-CM | POA: Diagnosis not present

## 2015-03-20 DIAGNOSIS — Z1389 Encounter for screening for other disorder: Secondary | ICD-10-CM | POA: Diagnosis not present

## 2015-03-20 DIAGNOSIS — Z79899 Other long term (current) drug therapy: Secondary | ICD-10-CM | POA: Diagnosis not present

## 2015-03-20 DIAGNOSIS — M109 Gout, unspecified: Secondary | ICD-10-CM | POA: Diagnosis not present

## 2015-04-26 DIAGNOSIS — S61451A Open bite of right hand, initial encounter: Secondary | ICD-10-CM | POA: Diagnosis not present

## 2015-04-26 DIAGNOSIS — W540XXA Bitten by dog, initial encounter: Secondary | ICD-10-CM | POA: Diagnosis not present

## 2015-04-26 DIAGNOSIS — S61459A Open bite of unspecified hand, initial encounter: Secondary | ICD-10-CM | POA: Diagnosis not present

## 2015-04-26 DIAGNOSIS — Z23 Encounter for immunization: Secondary | ICD-10-CM | POA: Diagnosis not present

## 2015-05-03 DIAGNOSIS — C44222 Squamous cell carcinoma of skin of right ear and external auricular canal: Secondary | ICD-10-CM | POA: Diagnosis not present

## 2015-05-03 DIAGNOSIS — L578 Other skin changes due to chronic exposure to nonionizing radiation: Secondary | ICD-10-CM | POA: Diagnosis not present

## 2015-05-03 DIAGNOSIS — L570 Actinic keratosis: Secondary | ICD-10-CM | POA: Diagnosis not present

## 2015-06-18 DIAGNOSIS — Z23 Encounter for immunization: Secondary | ICD-10-CM | POA: Diagnosis not present

## 2015-08-02 DIAGNOSIS — I69359 Hemiplegia and hemiparesis following cerebral infarction affecting unspecified side: Secondary | ICD-10-CM | POA: Diagnosis not present

## 2015-11-18 DIAGNOSIS — J159 Unspecified bacterial pneumonia: Secondary | ICD-10-CM | POA: Diagnosis not present

## 2015-11-18 DIAGNOSIS — R404 Transient alteration of awareness: Secondary | ICD-10-CM | POA: Diagnosis not present

## 2015-11-18 DIAGNOSIS — Z8673 Personal history of transient ischemic attack (TIA), and cerebral infarction without residual deficits: Secondary | ICD-10-CM | POA: Diagnosis not present

## 2015-11-18 DIAGNOSIS — I10 Essential (primary) hypertension: Secondary | ICD-10-CM | POA: Diagnosis not present

## 2015-11-18 DIAGNOSIS — Z79899 Other long term (current) drug therapy: Secondary | ICD-10-CM | POA: Diagnosis not present

## 2015-11-18 DIAGNOSIS — E860 Dehydration: Secondary | ICD-10-CM | POA: Diagnosis not present

## 2015-11-18 DIAGNOSIS — J9601 Acute respiratory failure with hypoxia: Secondary | ICD-10-CM | POA: Diagnosis not present

## 2015-11-18 DIAGNOSIS — G8929 Other chronic pain: Secondary | ICD-10-CM | POA: Diagnosis present

## 2015-11-18 DIAGNOSIS — Z888 Allergy status to other drugs, medicaments and biological substances status: Secondary | ICD-10-CM | POA: Diagnosis not present

## 2015-11-18 DIAGNOSIS — Z85038 Personal history of other malignant neoplasm of large intestine: Secondary | ICD-10-CM | POA: Diagnosis not present

## 2015-11-18 DIAGNOSIS — R55 Syncope and collapse: Secondary | ICD-10-CM | POA: Diagnosis not present

## 2015-11-18 DIAGNOSIS — M79605 Pain in left leg: Secondary | ICD-10-CM | POA: Diagnosis present

## 2015-11-18 DIAGNOSIS — J13 Pneumonia due to Streptococcus pneumoniae: Secondary | ICD-10-CM | POA: Diagnosis not present

## 2015-11-23 DIAGNOSIS — I872 Venous insufficiency (chronic) (peripheral): Secondary | ICD-10-CM | POA: Diagnosis not present

## 2015-11-23 DIAGNOSIS — R2681 Unsteadiness on feet: Secondary | ICD-10-CM | POA: Diagnosis not present

## 2015-11-23 DIAGNOSIS — M21372 Foot drop, left foot: Secondary | ICD-10-CM | POA: Diagnosis not present

## 2015-11-23 DIAGNOSIS — J189 Pneumonia, unspecified organism: Secondary | ICD-10-CM | POA: Diagnosis not present

## 2015-11-23 DIAGNOSIS — I10 Essential (primary) hypertension: Secondary | ICD-10-CM | POA: Diagnosis not present

## 2015-11-23 DIAGNOSIS — G8929 Other chronic pain: Secondary | ICD-10-CM | POA: Diagnosis not present

## 2015-11-24 DIAGNOSIS — I10 Essential (primary) hypertension: Secondary | ICD-10-CM | POA: Diagnosis not present

## 2015-11-24 DIAGNOSIS — M21372 Foot drop, left foot: Secondary | ICD-10-CM | POA: Diagnosis not present

## 2015-11-24 DIAGNOSIS — G8929 Other chronic pain: Secondary | ICD-10-CM | POA: Diagnosis not present

## 2015-11-24 DIAGNOSIS — I872 Venous insufficiency (chronic) (peripheral): Secondary | ICD-10-CM | POA: Diagnosis not present

## 2015-11-24 DIAGNOSIS — R2681 Unsteadiness on feet: Secondary | ICD-10-CM | POA: Diagnosis not present

## 2015-11-24 DIAGNOSIS — J189 Pneumonia, unspecified organism: Secondary | ICD-10-CM | POA: Diagnosis not present

## 2015-11-27 DIAGNOSIS — M21372 Foot drop, left foot: Secondary | ICD-10-CM | POA: Diagnosis not present

## 2015-11-27 DIAGNOSIS — G8929 Other chronic pain: Secondary | ICD-10-CM | POA: Diagnosis not present

## 2015-11-27 DIAGNOSIS — J189 Pneumonia, unspecified organism: Secondary | ICD-10-CM | POA: Diagnosis not present

## 2015-11-27 DIAGNOSIS — R2681 Unsteadiness on feet: Secondary | ICD-10-CM | POA: Diagnosis not present

## 2015-11-27 DIAGNOSIS — I872 Venous insufficiency (chronic) (peripheral): Secondary | ICD-10-CM | POA: Diagnosis not present

## 2015-11-27 DIAGNOSIS — I10 Essential (primary) hypertension: Secondary | ICD-10-CM | POA: Diagnosis not present

## 2015-11-28 DIAGNOSIS — I10 Essential (primary) hypertension: Secondary | ICD-10-CM | POA: Diagnosis not present

## 2015-11-28 DIAGNOSIS — J189 Pneumonia, unspecified organism: Secondary | ICD-10-CM | POA: Diagnosis not present

## 2015-11-28 DIAGNOSIS — G8929 Other chronic pain: Secondary | ICD-10-CM | POA: Diagnosis not present

## 2015-11-28 DIAGNOSIS — R2681 Unsteadiness on feet: Secondary | ICD-10-CM | POA: Diagnosis not present

## 2015-11-28 DIAGNOSIS — M21372 Foot drop, left foot: Secondary | ICD-10-CM | POA: Diagnosis not present

## 2015-11-28 DIAGNOSIS — I872 Venous insufficiency (chronic) (peripheral): Secondary | ICD-10-CM | POA: Diagnosis not present

## 2015-11-29 DIAGNOSIS — M21372 Foot drop, left foot: Secondary | ICD-10-CM | POA: Diagnosis not present

## 2015-11-29 DIAGNOSIS — R2681 Unsteadiness on feet: Secondary | ICD-10-CM | POA: Diagnosis not present

## 2015-11-29 DIAGNOSIS — Z8701 Personal history of pneumonia (recurrent): Secondary | ICD-10-CM | POA: Diagnosis not present

## 2015-11-29 DIAGNOSIS — J189 Pneumonia, unspecified organism: Secondary | ICD-10-CM | POA: Diagnosis not present

## 2015-11-29 DIAGNOSIS — G8929 Other chronic pain: Secondary | ICD-10-CM | POA: Diagnosis not present

## 2015-11-29 DIAGNOSIS — I10 Essential (primary) hypertension: Secondary | ICD-10-CM | POA: Diagnosis not present

## 2015-11-29 DIAGNOSIS — I872 Venous insufficiency (chronic) (peripheral): Secondary | ICD-10-CM | POA: Diagnosis not present

## 2015-11-30 DIAGNOSIS — R2681 Unsteadiness on feet: Secondary | ICD-10-CM | POA: Diagnosis not present

## 2015-11-30 DIAGNOSIS — J189 Pneumonia, unspecified organism: Secondary | ICD-10-CM | POA: Diagnosis not present

## 2015-11-30 DIAGNOSIS — I872 Venous insufficiency (chronic) (peripheral): Secondary | ICD-10-CM | POA: Diagnosis not present

## 2015-11-30 DIAGNOSIS — G8929 Other chronic pain: Secondary | ICD-10-CM | POA: Diagnosis not present

## 2015-11-30 DIAGNOSIS — M21372 Foot drop, left foot: Secondary | ICD-10-CM | POA: Diagnosis not present

## 2015-11-30 DIAGNOSIS — I10 Essential (primary) hypertension: Secondary | ICD-10-CM | POA: Diagnosis not present

## 2015-12-04 DIAGNOSIS — I872 Venous insufficiency (chronic) (peripheral): Secondary | ICD-10-CM | POA: Diagnosis not present

## 2015-12-04 DIAGNOSIS — M21372 Foot drop, left foot: Secondary | ICD-10-CM | POA: Diagnosis not present

## 2015-12-04 DIAGNOSIS — J189 Pneumonia, unspecified organism: Secondary | ICD-10-CM | POA: Diagnosis not present

## 2015-12-04 DIAGNOSIS — I10 Essential (primary) hypertension: Secondary | ICD-10-CM | POA: Diagnosis not present

## 2015-12-04 DIAGNOSIS — R2681 Unsteadiness on feet: Secondary | ICD-10-CM | POA: Diagnosis not present

## 2015-12-04 DIAGNOSIS — G8929 Other chronic pain: Secondary | ICD-10-CM | POA: Diagnosis not present

## 2015-12-06 DIAGNOSIS — I872 Venous insufficiency (chronic) (peripheral): Secondary | ICD-10-CM | POA: Diagnosis not present

## 2015-12-06 DIAGNOSIS — I10 Essential (primary) hypertension: Secondary | ICD-10-CM | POA: Diagnosis not present

## 2015-12-06 DIAGNOSIS — R2681 Unsteadiness on feet: Secondary | ICD-10-CM | POA: Diagnosis not present

## 2015-12-06 DIAGNOSIS — J189 Pneumonia, unspecified organism: Secondary | ICD-10-CM | POA: Diagnosis not present

## 2015-12-06 DIAGNOSIS — M21372 Foot drop, left foot: Secondary | ICD-10-CM | POA: Diagnosis not present

## 2015-12-06 DIAGNOSIS — G8929 Other chronic pain: Secondary | ICD-10-CM | POA: Diagnosis not present

## 2015-12-10 DIAGNOSIS — I10 Essential (primary) hypertension: Secondary | ICD-10-CM | POA: Diagnosis not present

## 2015-12-10 DIAGNOSIS — G8929 Other chronic pain: Secondary | ICD-10-CM | POA: Diagnosis not present

## 2015-12-10 DIAGNOSIS — I872 Venous insufficiency (chronic) (peripheral): Secondary | ICD-10-CM | POA: Diagnosis not present

## 2015-12-10 DIAGNOSIS — J189 Pneumonia, unspecified organism: Secondary | ICD-10-CM | POA: Diagnosis not present

## 2015-12-10 DIAGNOSIS — R2681 Unsteadiness on feet: Secondary | ICD-10-CM | POA: Diagnosis not present

## 2015-12-10 DIAGNOSIS — M21372 Foot drop, left foot: Secondary | ICD-10-CM | POA: Diagnosis not present

## 2015-12-12 DIAGNOSIS — M21372 Foot drop, left foot: Secondary | ICD-10-CM | POA: Diagnosis not present

## 2015-12-12 DIAGNOSIS — G8929 Other chronic pain: Secondary | ICD-10-CM | POA: Diagnosis not present

## 2015-12-12 DIAGNOSIS — R2681 Unsteadiness on feet: Secondary | ICD-10-CM | POA: Diagnosis not present

## 2015-12-12 DIAGNOSIS — I10 Essential (primary) hypertension: Secondary | ICD-10-CM | POA: Diagnosis not present

## 2015-12-12 DIAGNOSIS — J189 Pneumonia, unspecified organism: Secondary | ICD-10-CM | POA: Diagnosis not present

## 2015-12-12 DIAGNOSIS — I872 Venous insufficiency (chronic) (peripheral): Secondary | ICD-10-CM | POA: Diagnosis not present

## 2015-12-13 ENCOUNTER — Observation Stay (HOSPITAL_COMMUNITY)
Admission: AD | Admit: 2015-12-13 | Payer: Self-pay | Source: Other Acute Inpatient Hospital | Admitting: Internal Medicine

## 2015-12-13 DIAGNOSIS — I669 Occlusion and stenosis of unspecified cerebral artery: Secondary | ICD-10-CM | POA: Diagnosis not present

## 2015-12-13 DIAGNOSIS — R402411 Glasgow coma scale score 13-15, in the field [EMT or ambulance]: Secondary | ICD-10-CM | POA: Diagnosis not present

## 2015-12-13 DIAGNOSIS — R4701 Aphasia: Secondary | ICD-10-CM

## 2015-12-13 DIAGNOSIS — R4182 Altered mental status, unspecified: Secondary | ICD-10-CM | POA: Diagnosis not present

## 2015-12-13 DIAGNOSIS — I639 Cerebral infarction, unspecified: Secondary | ICD-10-CM | POA: Diagnosis not present

## 2015-12-13 DIAGNOSIS — R918 Other nonspecific abnormal finding of lung field: Secondary | ICD-10-CM | POA: Diagnosis not present

## 2015-12-13 DIAGNOSIS — Z8673 Personal history of transient ischemic attack (TIA), and cerebral infarction without residual deficits: Secondary | ICD-10-CM | POA: Diagnosis not present

## 2015-12-13 DIAGNOSIS — Z888 Allergy status to other drugs, medicaments and biological substances status: Secondary | ICD-10-CM | POA: Diagnosis not present

## 2015-12-13 DIAGNOSIS — I10 Essential (primary) hypertension: Secondary | ICD-10-CM | POA: Diagnosis not present

## 2015-12-13 NOTE — Progress Notes (Addendum)
Received information transfer from [facility] from Dr. Marnette Burgess. Gary Gibbs is a 82 year old male with past medical history significant for CVA in 2005 without residual deficit; who presents with expressive aphasia. Last noted normal around 7 AM. Patient was noted to be mowing grass this afternoon and fell off the one wife went to go check on him speech was garbled. Initial vital signs were unremarkable except for blood pressure noted to be 195/96, lab work was noted to be unremarkable, CT of the head showed no acute abnormalities, and EKG showing normal sinus rhythm. Patient was noted to be otherwise stable and still reported to have improvement in speech, but still having some word finding difficulty. Discussed with Dr. Marnette Burgess the need to call neurology to make them aware of the patient prior to any transport. Okay with admitting patient to a telemetry bed for observation.  Was re-paged at 9:55 PM about patient and there was note of no telemetry beds at [facility]. Discussed if patient needed MRI that it would be better for him to be transported to [facility],  and to check back with bed desk on possible availability.

## 2015-12-14 DIAGNOSIS — I119 Hypertensive heart disease without heart failure: Secondary | ICD-10-CM | POA: Diagnosis not present

## 2015-12-14 DIAGNOSIS — R531 Weakness: Secondary | ICD-10-CM | POA: Diagnosis not present

## 2015-12-14 DIAGNOSIS — M21372 Foot drop, left foot: Secondary | ICD-10-CM | POA: Diagnosis not present

## 2015-12-14 DIAGNOSIS — R4701 Aphasia: Secondary | ICD-10-CM | POA: Diagnosis not present

## 2015-12-14 DIAGNOSIS — I639 Cerebral infarction, unspecified: Secondary | ICD-10-CM | POA: Diagnosis not present

## 2015-12-14 DIAGNOSIS — Z7409 Other reduced mobility: Secondary | ICD-10-CM | POA: Diagnosis not present

## 2015-12-14 DIAGNOSIS — I63412 Cerebral infarction due to embolism of left middle cerebral artery: Secondary | ICD-10-CM | POA: Diagnosis not present

## 2015-12-14 DIAGNOSIS — I6350 Cerebral infarction due to unspecified occlusion or stenosis of unspecified cerebral artery: Secondary | ICD-10-CM | POA: Diagnosis not present

## 2015-12-14 DIAGNOSIS — Z23 Encounter for immunization: Secondary | ICD-10-CM | POA: Diagnosis not present

## 2015-12-14 DIAGNOSIS — G459 Transient cerebral ischemic attack, unspecified: Secondary | ICD-10-CM | POA: Diagnosis not present

## 2015-12-14 DIAGNOSIS — Z886 Allergy status to analgesic agent status: Secondary | ICD-10-CM | POA: Diagnosis not present

## 2015-12-14 DIAGNOSIS — I6789 Other cerebrovascular disease: Secondary | ICD-10-CM | POA: Diagnosis not present

## 2015-12-14 DIAGNOSIS — I491 Atrial premature depolarization: Secondary | ICD-10-CM | POA: Diagnosis not present

## 2015-12-14 DIAGNOSIS — R4781 Slurred speech: Secondary | ICD-10-CM | POA: Diagnosis not present

## 2015-12-15 DIAGNOSIS — I638 Other cerebral infarction: Secondary | ICD-10-CM | POA: Diagnosis not present

## 2015-12-19 DIAGNOSIS — I10 Essential (primary) hypertension: Secondary | ICD-10-CM | POA: Diagnosis not present

## 2015-12-19 DIAGNOSIS — I872 Venous insufficiency (chronic) (peripheral): Secondary | ICD-10-CM | POA: Diagnosis not present

## 2015-12-19 DIAGNOSIS — G8929 Other chronic pain: Secondary | ICD-10-CM | POA: Diagnosis not present

## 2015-12-19 DIAGNOSIS — M21372 Foot drop, left foot: Secondary | ICD-10-CM | POA: Diagnosis not present

## 2015-12-19 DIAGNOSIS — J189 Pneumonia, unspecified organism: Secondary | ICD-10-CM | POA: Diagnosis not present

## 2015-12-19 DIAGNOSIS — R2681 Unsteadiness on feet: Secondary | ICD-10-CM | POA: Diagnosis not present

## 2015-12-21 DIAGNOSIS — R2681 Unsteadiness on feet: Secondary | ICD-10-CM | POA: Diagnosis not present

## 2015-12-21 DIAGNOSIS — I10 Essential (primary) hypertension: Secondary | ICD-10-CM | POA: Diagnosis not present

## 2015-12-21 DIAGNOSIS — I872 Venous insufficiency (chronic) (peripheral): Secondary | ICD-10-CM | POA: Diagnosis not present

## 2015-12-21 DIAGNOSIS — J189 Pneumonia, unspecified organism: Secondary | ICD-10-CM | POA: Diagnosis not present

## 2015-12-21 DIAGNOSIS — M21372 Foot drop, left foot: Secondary | ICD-10-CM | POA: Diagnosis not present

## 2015-12-21 DIAGNOSIS — G8929 Other chronic pain: Secondary | ICD-10-CM | POA: Diagnosis not present

## 2015-12-25 DIAGNOSIS — I872 Venous insufficiency (chronic) (peripheral): Secondary | ICD-10-CM | POA: Diagnosis not present

## 2015-12-25 DIAGNOSIS — R2681 Unsteadiness on feet: Secondary | ICD-10-CM | POA: Diagnosis not present

## 2015-12-25 DIAGNOSIS — M21372 Foot drop, left foot: Secondary | ICD-10-CM | POA: Diagnosis not present

## 2015-12-25 DIAGNOSIS — I10 Essential (primary) hypertension: Secondary | ICD-10-CM | POA: Diagnosis not present

## 2015-12-25 DIAGNOSIS — G8929 Other chronic pain: Secondary | ICD-10-CM | POA: Diagnosis not present

## 2015-12-25 DIAGNOSIS — J189 Pneumonia, unspecified organism: Secondary | ICD-10-CM | POA: Diagnosis not present

## 2015-12-26 DIAGNOSIS — M21372 Foot drop, left foot: Secondary | ICD-10-CM | POA: Diagnosis not present

## 2015-12-26 DIAGNOSIS — J189 Pneumonia, unspecified organism: Secondary | ICD-10-CM | POA: Diagnosis not present

## 2015-12-26 DIAGNOSIS — R2681 Unsteadiness on feet: Secondary | ICD-10-CM | POA: Diagnosis not present

## 2015-12-26 DIAGNOSIS — G8929 Other chronic pain: Secondary | ICD-10-CM | POA: Diagnosis not present

## 2015-12-26 DIAGNOSIS — I10 Essential (primary) hypertension: Secondary | ICD-10-CM | POA: Diagnosis not present

## 2015-12-26 DIAGNOSIS — I872 Venous insufficiency (chronic) (peripheral): Secondary | ICD-10-CM | POA: Diagnosis not present

## 2015-12-27 DIAGNOSIS — E785 Hyperlipidemia, unspecified: Secondary | ICD-10-CM | POA: Diagnosis not present

## 2015-12-27 DIAGNOSIS — I10 Essential (primary) hypertension: Secondary | ICD-10-CM | POA: Diagnosis not present

## 2015-12-27 DIAGNOSIS — J189 Pneumonia, unspecified organism: Secondary | ICD-10-CM | POA: Diagnosis not present

## 2015-12-27 DIAGNOSIS — I639 Cerebral infarction, unspecified: Secondary | ICD-10-CM | POA: Diagnosis not present

## 2015-12-27 DIAGNOSIS — I872 Venous insufficiency (chronic) (peripheral): Secondary | ICD-10-CM | POA: Diagnosis not present

## 2015-12-27 DIAGNOSIS — Z0000 Encounter for general adult medical examination without abnormal findings: Secondary | ICD-10-CM | POA: Diagnosis not present

## 2015-12-27 DIAGNOSIS — G8929 Other chronic pain: Secondary | ICD-10-CM | POA: Diagnosis not present

## 2015-12-27 DIAGNOSIS — M21372 Foot drop, left foot: Secondary | ICD-10-CM | POA: Diagnosis not present

## 2015-12-27 DIAGNOSIS — R2681 Unsteadiness on feet: Secondary | ICD-10-CM | POA: Diagnosis not present

## 2015-12-28 DIAGNOSIS — J189 Pneumonia, unspecified organism: Secondary | ICD-10-CM | POA: Diagnosis not present

## 2015-12-28 DIAGNOSIS — R2681 Unsteadiness on feet: Secondary | ICD-10-CM | POA: Diagnosis not present

## 2015-12-28 DIAGNOSIS — I10 Essential (primary) hypertension: Secondary | ICD-10-CM | POA: Diagnosis not present

## 2015-12-28 DIAGNOSIS — I872 Venous insufficiency (chronic) (peripheral): Secondary | ICD-10-CM | POA: Diagnosis not present

## 2015-12-28 DIAGNOSIS — M21372 Foot drop, left foot: Secondary | ICD-10-CM | POA: Diagnosis not present

## 2015-12-28 DIAGNOSIS — G8929 Other chronic pain: Secondary | ICD-10-CM | POA: Diagnosis not present

## 2015-12-31 DIAGNOSIS — R2681 Unsteadiness on feet: Secondary | ICD-10-CM | POA: Diagnosis not present

## 2015-12-31 DIAGNOSIS — I10 Essential (primary) hypertension: Secondary | ICD-10-CM | POA: Diagnosis not present

## 2015-12-31 DIAGNOSIS — M21372 Foot drop, left foot: Secondary | ICD-10-CM | POA: Diagnosis not present

## 2015-12-31 DIAGNOSIS — G8929 Other chronic pain: Secondary | ICD-10-CM | POA: Diagnosis not present

## 2015-12-31 DIAGNOSIS — J189 Pneumonia, unspecified organism: Secondary | ICD-10-CM | POA: Diagnosis not present

## 2015-12-31 DIAGNOSIS — I872 Venous insufficiency (chronic) (peripheral): Secondary | ICD-10-CM | POA: Diagnosis not present

## 2016-01-01 DIAGNOSIS — G8929 Other chronic pain: Secondary | ICD-10-CM | POA: Diagnosis not present

## 2016-01-01 DIAGNOSIS — M21372 Foot drop, left foot: Secondary | ICD-10-CM | POA: Diagnosis not present

## 2016-01-01 DIAGNOSIS — I872 Venous insufficiency (chronic) (peripheral): Secondary | ICD-10-CM | POA: Diagnosis not present

## 2016-01-01 DIAGNOSIS — J189 Pneumonia, unspecified organism: Secondary | ICD-10-CM | POA: Diagnosis not present

## 2016-01-01 DIAGNOSIS — I10 Essential (primary) hypertension: Secondary | ICD-10-CM | POA: Diagnosis not present

## 2016-01-01 DIAGNOSIS — R2681 Unsteadiness on feet: Secondary | ICD-10-CM | POA: Diagnosis not present

## 2016-01-02 DIAGNOSIS — I872 Venous insufficiency (chronic) (peripheral): Secondary | ICD-10-CM | POA: Diagnosis not present

## 2016-01-02 DIAGNOSIS — R2681 Unsteadiness on feet: Secondary | ICD-10-CM | POA: Diagnosis not present

## 2016-01-02 DIAGNOSIS — J189 Pneumonia, unspecified organism: Secondary | ICD-10-CM | POA: Diagnosis not present

## 2016-01-02 DIAGNOSIS — M21372 Foot drop, left foot: Secondary | ICD-10-CM | POA: Diagnosis not present

## 2016-01-02 DIAGNOSIS — I10 Essential (primary) hypertension: Secondary | ICD-10-CM | POA: Diagnosis not present

## 2016-01-02 DIAGNOSIS — G8929 Other chronic pain: Secondary | ICD-10-CM | POA: Diagnosis not present

## 2016-01-03 DIAGNOSIS — I739 Peripheral vascular disease, unspecified: Secondary | ICD-10-CM | POA: Diagnosis not present

## 2016-01-03 DIAGNOSIS — Z7982 Long term (current) use of aspirin: Secondary | ICD-10-CM | POA: Diagnosis not present

## 2016-01-03 DIAGNOSIS — I10 Essential (primary) hypertension: Secondary | ICD-10-CM | POA: Diagnosis not present

## 2016-01-03 DIAGNOSIS — I63032 Cerebral infarction due to thrombosis of left carotid artery: Secondary | ICD-10-CM | POA: Diagnosis not present

## 2016-01-03 DIAGNOSIS — Z7409 Other reduced mobility: Secondary | ICD-10-CM | POA: Diagnosis not present

## 2016-01-03 DIAGNOSIS — Z79899 Other long term (current) drug therapy: Secondary | ICD-10-CM | POA: Diagnosis not present

## 2016-01-04 DIAGNOSIS — M21372 Foot drop, left foot: Secondary | ICD-10-CM | POA: Diagnosis not present

## 2016-01-04 DIAGNOSIS — J189 Pneumonia, unspecified organism: Secondary | ICD-10-CM | POA: Diagnosis not present

## 2016-01-04 DIAGNOSIS — I872 Venous insufficiency (chronic) (peripheral): Secondary | ICD-10-CM | POA: Diagnosis not present

## 2016-01-04 DIAGNOSIS — G8929 Other chronic pain: Secondary | ICD-10-CM | POA: Diagnosis not present

## 2016-01-04 DIAGNOSIS — R2681 Unsteadiness on feet: Secondary | ICD-10-CM | POA: Diagnosis not present

## 2016-01-04 DIAGNOSIS — I10 Essential (primary) hypertension: Secondary | ICD-10-CM | POA: Diagnosis not present

## 2016-01-07 DIAGNOSIS — J189 Pneumonia, unspecified organism: Secondary | ICD-10-CM | POA: Diagnosis not present

## 2016-01-07 DIAGNOSIS — M21372 Foot drop, left foot: Secondary | ICD-10-CM | POA: Diagnosis not present

## 2016-01-07 DIAGNOSIS — G8929 Other chronic pain: Secondary | ICD-10-CM | POA: Diagnosis not present

## 2016-01-07 DIAGNOSIS — R2681 Unsteadiness on feet: Secondary | ICD-10-CM | POA: Diagnosis not present

## 2016-01-07 DIAGNOSIS — I872 Venous insufficiency (chronic) (peripheral): Secondary | ICD-10-CM | POA: Diagnosis not present

## 2016-01-07 DIAGNOSIS — I10 Essential (primary) hypertension: Secondary | ICD-10-CM | POA: Diagnosis not present

## 2016-01-08 DIAGNOSIS — J189 Pneumonia, unspecified organism: Secondary | ICD-10-CM | POA: Diagnosis not present

## 2016-01-08 DIAGNOSIS — G8929 Other chronic pain: Secondary | ICD-10-CM | POA: Diagnosis not present

## 2016-01-08 DIAGNOSIS — M21372 Foot drop, left foot: Secondary | ICD-10-CM | POA: Diagnosis not present

## 2016-01-08 DIAGNOSIS — I872 Venous insufficiency (chronic) (peripheral): Secondary | ICD-10-CM | POA: Diagnosis not present

## 2016-01-08 DIAGNOSIS — R2681 Unsteadiness on feet: Secondary | ICD-10-CM | POA: Diagnosis not present

## 2016-01-08 DIAGNOSIS — I10 Essential (primary) hypertension: Secondary | ICD-10-CM | POA: Diagnosis not present

## 2016-01-09 DIAGNOSIS — I872 Venous insufficiency (chronic) (peripheral): Secondary | ICD-10-CM | POA: Diagnosis not present

## 2016-01-09 DIAGNOSIS — R2681 Unsteadiness on feet: Secondary | ICD-10-CM | POA: Diagnosis not present

## 2016-01-09 DIAGNOSIS — M21372 Foot drop, left foot: Secondary | ICD-10-CM | POA: Diagnosis not present

## 2016-01-09 DIAGNOSIS — J189 Pneumonia, unspecified organism: Secondary | ICD-10-CM | POA: Diagnosis not present

## 2016-01-09 DIAGNOSIS — I10 Essential (primary) hypertension: Secondary | ICD-10-CM | POA: Diagnosis not present

## 2016-01-09 DIAGNOSIS — G8929 Other chronic pain: Secondary | ICD-10-CM | POA: Diagnosis not present

## 2016-01-10 DIAGNOSIS — I69359 Hemiplegia and hemiparesis following cerebral infarction affecting unspecified side: Secondary | ICD-10-CM | POA: Diagnosis not present

## 2016-01-10 DIAGNOSIS — G8929 Other chronic pain: Secondary | ICD-10-CM | POA: Diagnosis not present

## 2016-01-10 DIAGNOSIS — J189 Pneumonia, unspecified organism: Secondary | ICD-10-CM | POA: Diagnosis not present

## 2016-01-10 DIAGNOSIS — I639 Cerebral infarction, unspecified: Secondary | ICD-10-CM | POA: Diagnosis not present

## 2016-01-10 DIAGNOSIS — I10 Essential (primary) hypertension: Secondary | ICD-10-CM | POA: Diagnosis not present

## 2016-01-10 DIAGNOSIS — M21372 Foot drop, left foot: Secondary | ICD-10-CM | POA: Diagnosis not present

## 2016-01-10 DIAGNOSIS — R2681 Unsteadiness on feet: Secondary | ICD-10-CM | POA: Diagnosis not present

## 2016-01-10 DIAGNOSIS — I872 Venous insufficiency (chronic) (peripheral): Secondary | ICD-10-CM | POA: Diagnosis not present

## 2016-01-15 DIAGNOSIS — G8929 Other chronic pain: Secondary | ICD-10-CM | POA: Diagnosis not present

## 2016-01-15 DIAGNOSIS — I10 Essential (primary) hypertension: Secondary | ICD-10-CM | POA: Diagnosis not present

## 2016-01-15 DIAGNOSIS — J189 Pneumonia, unspecified organism: Secondary | ICD-10-CM | POA: Diagnosis not present

## 2016-01-15 DIAGNOSIS — M21372 Foot drop, left foot: Secondary | ICD-10-CM | POA: Diagnosis not present

## 2016-01-15 DIAGNOSIS — R2681 Unsteadiness on feet: Secondary | ICD-10-CM | POA: Diagnosis not present

## 2016-01-15 DIAGNOSIS — I872 Venous insufficiency (chronic) (peripheral): Secondary | ICD-10-CM | POA: Diagnosis not present

## 2016-01-16 DIAGNOSIS — R2681 Unsteadiness on feet: Secondary | ICD-10-CM | POA: Diagnosis not present

## 2016-01-16 DIAGNOSIS — G8929 Other chronic pain: Secondary | ICD-10-CM | POA: Diagnosis not present

## 2016-01-16 DIAGNOSIS — I872 Venous insufficiency (chronic) (peripheral): Secondary | ICD-10-CM | POA: Diagnosis not present

## 2016-01-16 DIAGNOSIS — J189 Pneumonia, unspecified organism: Secondary | ICD-10-CM | POA: Diagnosis not present

## 2016-01-16 DIAGNOSIS — M21372 Foot drop, left foot: Secondary | ICD-10-CM | POA: Diagnosis not present

## 2016-01-16 DIAGNOSIS — I10 Essential (primary) hypertension: Secondary | ICD-10-CM | POA: Diagnosis not present

## 2016-01-17 DIAGNOSIS — G8929 Other chronic pain: Secondary | ICD-10-CM | POA: Diagnosis not present

## 2016-01-17 DIAGNOSIS — R2681 Unsteadiness on feet: Secondary | ICD-10-CM | POA: Diagnosis not present

## 2016-01-17 DIAGNOSIS — M21372 Foot drop, left foot: Secondary | ICD-10-CM | POA: Diagnosis not present

## 2016-01-17 DIAGNOSIS — I10 Essential (primary) hypertension: Secondary | ICD-10-CM | POA: Diagnosis not present

## 2016-01-17 DIAGNOSIS — J189 Pneumonia, unspecified organism: Secondary | ICD-10-CM | POA: Diagnosis not present

## 2016-01-17 DIAGNOSIS — I872 Venous insufficiency (chronic) (peripheral): Secondary | ICD-10-CM | POA: Diagnosis not present

## 2016-01-22 DIAGNOSIS — M21372 Foot drop, left foot: Secondary | ICD-10-CM | POA: Diagnosis not present

## 2016-01-22 DIAGNOSIS — G8929 Other chronic pain: Secondary | ICD-10-CM | POA: Diagnosis not present

## 2016-01-22 DIAGNOSIS — I10 Essential (primary) hypertension: Secondary | ICD-10-CM | POA: Diagnosis not present

## 2016-01-22 DIAGNOSIS — I69320 Aphasia following cerebral infarction: Secondary | ICD-10-CM | POA: Diagnosis not present

## 2016-01-22 DIAGNOSIS — Z9181 History of falling: Secondary | ICD-10-CM | POA: Diagnosis not present

## 2016-01-22 DIAGNOSIS — I872 Venous insufficiency (chronic) (peripheral): Secondary | ICD-10-CM | POA: Diagnosis not present

## 2016-01-24 DIAGNOSIS — I69320 Aphasia following cerebral infarction: Secondary | ICD-10-CM | POA: Diagnosis not present

## 2016-01-24 DIAGNOSIS — I872 Venous insufficiency (chronic) (peripheral): Secondary | ICD-10-CM | POA: Diagnosis not present

## 2016-01-24 DIAGNOSIS — I10 Essential (primary) hypertension: Secondary | ICD-10-CM | POA: Diagnosis not present

## 2016-01-24 DIAGNOSIS — G8929 Other chronic pain: Secondary | ICD-10-CM | POA: Diagnosis not present

## 2016-01-24 DIAGNOSIS — Z9181 History of falling: Secondary | ICD-10-CM | POA: Diagnosis not present

## 2016-01-24 DIAGNOSIS — M21372 Foot drop, left foot: Secondary | ICD-10-CM | POA: Diagnosis not present

## 2016-01-29 DIAGNOSIS — M21372 Foot drop, left foot: Secondary | ICD-10-CM | POA: Diagnosis not present

## 2016-01-29 DIAGNOSIS — I872 Venous insufficiency (chronic) (peripheral): Secondary | ICD-10-CM | POA: Diagnosis not present

## 2016-01-29 DIAGNOSIS — Z9181 History of falling: Secondary | ICD-10-CM | POA: Diagnosis not present

## 2016-01-29 DIAGNOSIS — I69320 Aphasia following cerebral infarction: Secondary | ICD-10-CM | POA: Diagnosis not present

## 2016-01-29 DIAGNOSIS — G8929 Other chronic pain: Secondary | ICD-10-CM | POA: Diagnosis not present

## 2016-01-29 DIAGNOSIS — I10 Essential (primary) hypertension: Secondary | ICD-10-CM | POA: Diagnosis not present

## 2016-01-31 DIAGNOSIS — Z9181 History of falling: Secondary | ICD-10-CM | POA: Diagnosis not present

## 2016-01-31 DIAGNOSIS — M21372 Foot drop, left foot: Secondary | ICD-10-CM | POA: Diagnosis not present

## 2016-01-31 DIAGNOSIS — I10 Essential (primary) hypertension: Secondary | ICD-10-CM | POA: Diagnosis not present

## 2016-01-31 DIAGNOSIS — G8929 Other chronic pain: Secondary | ICD-10-CM | POA: Diagnosis not present

## 2016-01-31 DIAGNOSIS — I872 Venous insufficiency (chronic) (peripheral): Secondary | ICD-10-CM | POA: Diagnosis not present

## 2016-01-31 DIAGNOSIS — I69320 Aphasia following cerebral infarction: Secondary | ICD-10-CM | POA: Diagnosis not present

## 2016-02-06 DIAGNOSIS — Z9181 History of falling: Secondary | ICD-10-CM | POA: Diagnosis not present

## 2016-02-06 DIAGNOSIS — I10 Essential (primary) hypertension: Secondary | ICD-10-CM | POA: Diagnosis not present

## 2016-02-06 DIAGNOSIS — I69320 Aphasia following cerebral infarction: Secondary | ICD-10-CM | POA: Diagnosis not present

## 2016-02-06 DIAGNOSIS — G8929 Other chronic pain: Secondary | ICD-10-CM | POA: Diagnosis not present

## 2016-02-06 DIAGNOSIS — I872 Venous insufficiency (chronic) (peripheral): Secondary | ICD-10-CM | POA: Diagnosis not present

## 2016-02-06 DIAGNOSIS — M21372 Foot drop, left foot: Secondary | ICD-10-CM | POA: Diagnosis not present

## 2016-02-08 DIAGNOSIS — E785 Hyperlipidemia, unspecified: Secondary | ICD-10-CM | POA: Diagnosis not present

## 2016-02-08 DIAGNOSIS — I639 Cerebral infarction, unspecified: Secondary | ICD-10-CM | POA: Diagnosis not present

## 2016-02-08 DIAGNOSIS — I10 Essential (primary) hypertension: Secondary | ICD-10-CM | POA: Diagnosis not present

## 2016-02-11 DIAGNOSIS — I69320 Aphasia following cerebral infarction: Secondary | ICD-10-CM | POA: Diagnosis not present

## 2016-02-11 DIAGNOSIS — I872 Venous insufficiency (chronic) (peripheral): Secondary | ICD-10-CM | POA: Diagnosis not present

## 2016-02-11 DIAGNOSIS — G8929 Other chronic pain: Secondary | ICD-10-CM | POA: Diagnosis not present

## 2016-02-11 DIAGNOSIS — Z9181 History of falling: Secondary | ICD-10-CM | POA: Diagnosis not present

## 2016-02-11 DIAGNOSIS — I10 Essential (primary) hypertension: Secondary | ICD-10-CM | POA: Diagnosis not present

## 2016-02-11 DIAGNOSIS — M21372 Foot drop, left foot: Secondary | ICD-10-CM | POA: Diagnosis not present

## 2016-02-14 DIAGNOSIS — G8929 Other chronic pain: Secondary | ICD-10-CM | POA: Diagnosis not present

## 2016-02-14 DIAGNOSIS — Z9181 History of falling: Secondary | ICD-10-CM | POA: Diagnosis not present

## 2016-02-14 DIAGNOSIS — M21372 Foot drop, left foot: Secondary | ICD-10-CM | POA: Diagnosis not present

## 2016-02-14 DIAGNOSIS — I10 Essential (primary) hypertension: Secondary | ICD-10-CM | POA: Diagnosis not present

## 2016-02-14 DIAGNOSIS — I872 Venous insufficiency (chronic) (peripheral): Secondary | ICD-10-CM | POA: Diagnosis not present

## 2016-02-14 DIAGNOSIS — I69320 Aphasia following cerebral infarction: Secondary | ICD-10-CM | POA: Diagnosis not present

## 2016-02-18 DIAGNOSIS — Z9181 History of falling: Secondary | ICD-10-CM | POA: Diagnosis not present

## 2016-02-18 DIAGNOSIS — G8929 Other chronic pain: Secondary | ICD-10-CM | POA: Diagnosis not present

## 2016-02-18 DIAGNOSIS — I69320 Aphasia following cerebral infarction: Secondary | ICD-10-CM | POA: Diagnosis not present

## 2016-02-18 DIAGNOSIS — I10 Essential (primary) hypertension: Secondary | ICD-10-CM | POA: Diagnosis not present

## 2016-02-18 DIAGNOSIS — I872 Venous insufficiency (chronic) (peripheral): Secondary | ICD-10-CM | POA: Diagnosis not present

## 2016-02-18 DIAGNOSIS — M21372 Foot drop, left foot: Secondary | ICD-10-CM | POA: Diagnosis not present

## 2016-02-20 DIAGNOSIS — I69320 Aphasia following cerebral infarction: Secondary | ICD-10-CM | POA: Diagnosis not present

## 2016-02-21 DIAGNOSIS — G8929 Other chronic pain: Secondary | ICD-10-CM | POA: Diagnosis not present

## 2016-02-21 DIAGNOSIS — Z9181 History of falling: Secondary | ICD-10-CM | POA: Diagnosis not present

## 2016-02-21 DIAGNOSIS — I69320 Aphasia following cerebral infarction: Secondary | ICD-10-CM | POA: Diagnosis not present

## 2016-02-21 DIAGNOSIS — I872 Venous insufficiency (chronic) (peripheral): Secondary | ICD-10-CM | POA: Diagnosis not present

## 2016-02-21 DIAGNOSIS — M21372 Foot drop, left foot: Secondary | ICD-10-CM | POA: Diagnosis not present

## 2016-02-21 DIAGNOSIS — I10 Essential (primary) hypertension: Secondary | ICD-10-CM | POA: Diagnosis not present

## 2016-02-25 DIAGNOSIS — I69320 Aphasia following cerebral infarction: Secondary | ICD-10-CM | POA: Diagnosis not present

## 2016-02-25 DIAGNOSIS — M21372 Foot drop, left foot: Secondary | ICD-10-CM | POA: Diagnosis not present

## 2016-02-25 DIAGNOSIS — G8929 Other chronic pain: Secondary | ICD-10-CM | POA: Diagnosis not present

## 2016-02-25 DIAGNOSIS — I872 Venous insufficiency (chronic) (peripheral): Secondary | ICD-10-CM | POA: Diagnosis not present

## 2016-02-25 DIAGNOSIS — I10 Essential (primary) hypertension: Secondary | ICD-10-CM | POA: Diagnosis not present

## 2016-02-25 DIAGNOSIS — Z9181 History of falling: Secondary | ICD-10-CM | POA: Diagnosis not present

## 2016-02-28 DIAGNOSIS — I10 Essential (primary) hypertension: Secondary | ICD-10-CM | POA: Diagnosis not present

## 2016-02-28 DIAGNOSIS — Z9181 History of falling: Secondary | ICD-10-CM | POA: Diagnosis not present

## 2016-02-28 DIAGNOSIS — I69320 Aphasia following cerebral infarction: Secondary | ICD-10-CM | POA: Diagnosis not present

## 2016-02-28 DIAGNOSIS — G8929 Other chronic pain: Secondary | ICD-10-CM | POA: Diagnosis not present

## 2016-02-28 DIAGNOSIS — M21372 Foot drop, left foot: Secondary | ICD-10-CM | POA: Diagnosis not present

## 2016-02-28 DIAGNOSIS — I872 Venous insufficiency (chronic) (peripheral): Secondary | ICD-10-CM | POA: Diagnosis not present

## 2016-03-04 DIAGNOSIS — G8929 Other chronic pain: Secondary | ICD-10-CM | POA: Diagnosis not present

## 2016-03-04 DIAGNOSIS — Z9181 History of falling: Secondary | ICD-10-CM | POA: Diagnosis not present

## 2016-03-04 DIAGNOSIS — I10 Essential (primary) hypertension: Secondary | ICD-10-CM | POA: Diagnosis not present

## 2016-03-04 DIAGNOSIS — M21372 Foot drop, left foot: Secondary | ICD-10-CM | POA: Diagnosis not present

## 2016-03-04 DIAGNOSIS — I872 Venous insufficiency (chronic) (peripheral): Secondary | ICD-10-CM | POA: Diagnosis not present

## 2016-03-04 DIAGNOSIS — I69320 Aphasia following cerebral infarction: Secondary | ICD-10-CM | POA: Diagnosis not present

## 2016-03-06 DIAGNOSIS — M21372 Foot drop, left foot: Secondary | ICD-10-CM | POA: Diagnosis not present

## 2016-03-06 DIAGNOSIS — I872 Venous insufficiency (chronic) (peripheral): Secondary | ICD-10-CM | POA: Diagnosis not present

## 2016-03-06 DIAGNOSIS — I69320 Aphasia following cerebral infarction: Secondary | ICD-10-CM | POA: Diagnosis not present

## 2016-03-06 DIAGNOSIS — I10 Essential (primary) hypertension: Secondary | ICD-10-CM | POA: Diagnosis not present

## 2016-03-06 DIAGNOSIS — G8929 Other chronic pain: Secondary | ICD-10-CM | POA: Diagnosis not present

## 2016-03-06 DIAGNOSIS — Z9181 History of falling: Secondary | ICD-10-CM | POA: Diagnosis not present

## 2016-03-11 DIAGNOSIS — I10 Essential (primary) hypertension: Secondary | ICD-10-CM | POA: Diagnosis not present

## 2016-03-11 DIAGNOSIS — I872 Venous insufficiency (chronic) (peripheral): Secondary | ICD-10-CM | POA: Diagnosis not present

## 2016-03-11 DIAGNOSIS — Z9181 History of falling: Secondary | ICD-10-CM | POA: Diagnosis not present

## 2016-03-11 DIAGNOSIS — I69320 Aphasia following cerebral infarction: Secondary | ICD-10-CM | POA: Diagnosis not present

## 2016-03-11 DIAGNOSIS — M21372 Foot drop, left foot: Secondary | ICD-10-CM | POA: Diagnosis not present

## 2016-03-11 DIAGNOSIS — G8929 Other chronic pain: Secondary | ICD-10-CM | POA: Diagnosis not present

## 2016-03-12 DIAGNOSIS — M21372 Foot drop, left foot: Secondary | ICD-10-CM | POA: Diagnosis not present

## 2016-03-12 DIAGNOSIS — I69320 Aphasia following cerebral infarction: Secondary | ICD-10-CM | POA: Diagnosis not present

## 2016-03-12 DIAGNOSIS — G8929 Other chronic pain: Secondary | ICD-10-CM | POA: Diagnosis not present

## 2016-03-12 DIAGNOSIS — Z9181 History of falling: Secondary | ICD-10-CM | POA: Diagnosis not present

## 2016-03-12 DIAGNOSIS — I10 Essential (primary) hypertension: Secondary | ICD-10-CM | POA: Diagnosis not present

## 2016-03-12 DIAGNOSIS — I872 Venous insufficiency (chronic) (peripheral): Secondary | ICD-10-CM | POA: Diagnosis not present

## 2016-04-02 DIAGNOSIS — L578 Other skin changes due to chronic exposure to nonionizing radiation: Secondary | ICD-10-CM | POA: Diagnosis not present

## 2016-04-02 DIAGNOSIS — L570 Actinic keratosis: Secondary | ICD-10-CM | POA: Diagnosis not present

## 2016-04-02 DIAGNOSIS — C44119 Basal cell carcinoma of skin of left eyelid, including canthus: Secondary | ICD-10-CM | POA: Diagnosis not present

## 2016-04-02 DIAGNOSIS — L219 Seborrheic dermatitis, unspecified: Secondary | ICD-10-CM | POA: Diagnosis not present

## 2016-04-02 DIAGNOSIS — L853 Xerosis cutis: Secondary | ICD-10-CM | POA: Diagnosis not present

## 2016-04-24 DIAGNOSIS — H25813 Combined forms of age-related cataract, bilateral: Secondary | ICD-10-CM | POA: Diagnosis not present

## 2016-04-24 DIAGNOSIS — H5203 Hypermetropia, bilateral: Secondary | ICD-10-CM | POA: Diagnosis not present

## 2016-04-24 DIAGNOSIS — H353131 Nonexudative age-related macular degeneration, bilateral, early dry stage: Secondary | ICD-10-CM | POA: Diagnosis not present

## 2016-04-24 DIAGNOSIS — H43813 Vitreous degeneration, bilateral: Secondary | ICD-10-CM | POA: Diagnosis not present

## 2016-04-24 DIAGNOSIS — H52223 Regular astigmatism, bilateral: Secondary | ICD-10-CM | POA: Diagnosis not present

## 2016-04-24 DIAGNOSIS — H43393 Other vitreous opacities, bilateral: Secondary | ICD-10-CM | POA: Diagnosis not present

## 2016-04-24 DIAGNOSIS — I10 Essential (primary) hypertension: Secondary | ICD-10-CM | POA: Diagnosis not present

## 2016-04-24 DIAGNOSIS — H524 Presbyopia: Secondary | ICD-10-CM | POA: Diagnosis not present

## 2016-05-29 DIAGNOSIS — Z23 Encounter for immunization: Secondary | ICD-10-CM | POA: Diagnosis not present

## 2016-07-17 DIAGNOSIS — L853 Xerosis cutis: Secondary | ICD-10-CM | POA: Diagnosis not present

## 2016-07-17 DIAGNOSIS — I8310 Varicose veins of unspecified lower extremity with inflammation: Secondary | ICD-10-CM | POA: Diagnosis not present

## 2016-07-17 DIAGNOSIS — L570 Actinic keratosis: Secondary | ICD-10-CM | POA: Diagnosis not present

## 2016-07-17 DIAGNOSIS — L578 Other skin changes due to chronic exposure to nonionizing radiation: Secondary | ICD-10-CM | POA: Diagnosis not present

## 2016-07-17 DIAGNOSIS — C44119 Basal cell carcinoma of skin of left eyelid, including canthus: Secondary | ICD-10-CM | POA: Diagnosis not present

## 2016-09-19 DIAGNOSIS — Z9181 History of falling: Secondary | ICD-10-CM | POA: Diagnosis not present

## 2016-09-19 DIAGNOSIS — Z1389 Encounter for screening for other disorder: Secondary | ICD-10-CM | POA: Diagnosis not present

## 2016-09-19 DIAGNOSIS — I69359 Hemiplegia and hemiparesis following cerebral infarction affecting unspecified side: Secondary | ICD-10-CM | POA: Diagnosis not present

## 2016-10-31 DIAGNOSIS — C44229 Squamous cell carcinoma of skin of left ear and external auricular canal: Secondary | ICD-10-CM | POA: Diagnosis not present

## 2017-03-10 DIAGNOSIS — L821 Other seborrheic keratosis: Secondary | ICD-10-CM | POA: Diagnosis not present

## 2017-03-10 DIAGNOSIS — L570 Actinic keratosis: Secondary | ICD-10-CM | POA: Diagnosis not present

## 2017-03-10 DIAGNOSIS — C4442 Squamous cell carcinoma of skin of scalp and neck: Secondary | ICD-10-CM | POA: Diagnosis not present

## 2017-03-10 DIAGNOSIS — C44229 Squamous cell carcinoma of skin of left ear and external auricular canal: Secondary | ICD-10-CM | POA: Diagnosis not present

## 2017-03-10 DIAGNOSIS — L578 Other skin changes due to chronic exposure to nonionizing radiation: Secondary | ICD-10-CM | POA: Diagnosis not present

## 2017-06-16 DIAGNOSIS — H353131 Nonexudative age-related macular degeneration, bilateral, early dry stage: Secondary | ICD-10-CM | POA: Diagnosis not present

## 2017-06-16 DIAGNOSIS — H52223 Regular astigmatism, bilateral: Secondary | ICD-10-CM | POA: Diagnosis not present

## 2017-06-16 DIAGNOSIS — H25813 Combined forms of age-related cataract, bilateral: Secondary | ICD-10-CM | POA: Diagnosis not present

## 2017-06-16 DIAGNOSIS — H43813 Vitreous degeneration, bilateral: Secondary | ICD-10-CM | POA: Diagnosis not present

## 2017-06-16 DIAGNOSIS — H5203 Hypermetropia, bilateral: Secondary | ICD-10-CM | POA: Diagnosis not present

## 2017-06-16 DIAGNOSIS — H524 Presbyopia: Secondary | ICD-10-CM | POA: Diagnosis not present

## 2017-06-16 DIAGNOSIS — I10 Essential (primary) hypertension: Secondary | ICD-10-CM | POA: Diagnosis not present

## 2017-06-16 DIAGNOSIS — H43393 Other vitreous opacities, bilateral: Secondary | ICD-10-CM | POA: Diagnosis not present

## 2017-06-17 DIAGNOSIS — Z0000 Encounter for general adult medical examination without abnormal findings: Secondary | ICD-10-CM | POA: Diagnosis not present

## 2017-06-17 DIAGNOSIS — I10 Essential (primary) hypertension: Secondary | ICD-10-CM | POA: Diagnosis not present

## 2017-06-17 DIAGNOSIS — Z79899 Other long term (current) drug therapy: Secondary | ICD-10-CM | POA: Diagnosis not present

## 2017-06-17 DIAGNOSIS — Z125 Encounter for screening for malignant neoplasm of prostate: Secondary | ICD-10-CM | POA: Diagnosis not present

## 2017-06-17 DIAGNOSIS — E785 Hyperlipidemia, unspecified: Secondary | ICD-10-CM | POA: Diagnosis not present

## 2017-06-17 DIAGNOSIS — Z23 Encounter for immunization: Secondary | ICD-10-CM | POA: Diagnosis not present

## 2017-06-17 DIAGNOSIS — M109 Gout, unspecified: Secondary | ICD-10-CM | POA: Diagnosis not present

## 2017-12-16 DIAGNOSIS — H25813 Combined forms of age-related cataract, bilateral: Secondary | ICD-10-CM | POA: Diagnosis not present

## 2018-02-08 DIAGNOSIS — R531 Weakness: Secondary | ICD-10-CM | POA: Diagnosis not present

## 2018-02-08 DIAGNOSIS — M25551 Pain in right hip: Secondary | ICD-10-CM | POA: Diagnosis not present

## 2018-02-08 DIAGNOSIS — I491 Atrial premature depolarization: Secondary | ICD-10-CM | POA: Diagnosis not present

## 2018-02-08 DIAGNOSIS — M25552 Pain in left hip: Secondary | ICD-10-CM | POA: Diagnosis not present

## 2018-02-08 DIAGNOSIS — Z8673 Personal history of transient ischemic attack (TIA), and cerebral infarction without residual deficits: Secondary | ICD-10-CM | POA: Diagnosis not present

## 2018-02-08 DIAGNOSIS — I639 Cerebral infarction, unspecified: Secondary | ICD-10-CM | POA: Diagnosis not present

## 2018-02-08 DIAGNOSIS — I10 Essential (primary) hypertension: Secondary | ICD-10-CM | POA: Diagnosis not present

## 2018-02-08 DIAGNOSIS — R52 Pain, unspecified: Secondary | ICD-10-CM | POA: Diagnosis not present

## 2018-02-08 DIAGNOSIS — Z87891 Personal history of nicotine dependence: Secondary | ICD-10-CM | POA: Diagnosis not present

## 2018-02-08 DIAGNOSIS — R630 Anorexia: Secondary | ICD-10-CM | POA: Diagnosis not present

## 2018-02-08 DIAGNOSIS — N200 Calculus of kidney: Secondary | ICD-10-CM | POA: Diagnosis not present

## 2018-02-08 DIAGNOSIS — E860 Dehydration: Secondary | ICD-10-CM | POA: Diagnosis not present

## 2018-02-11 DIAGNOSIS — Z791 Long term (current) use of non-steroidal anti-inflammatories (NSAID): Secondary | ICD-10-CM | POA: Diagnosis not present

## 2018-02-11 DIAGNOSIS — G609 Hereditary and idiopathic neuropathy, unspecified: Secondary | ICD-10-CM | POA: Diagnosis not present

## 2018-02-11 DIAGNOSIS — I10 Essential (primary) hypertension: Secondary | ICD-10-CM | POA: Diagnosis not present

## 2018-02-11 DIAGNOSIS — Z8673 Personal history of transient ischemic attack (TIA), and cerebral infarction without residual deficits: Secondary | ICD-10-CM | POA: Diagnosis not present

## 2018-02-11 DIAGNOSIS — N400 Enlarged prostate without lower urinary tract symptoms: Secondary | ICD-10-CM | POA: Diagnosis not present

## 2018-02-11 DIAGNOSIS — Z8701 Personal history of pneumonia (recurrent): Secondary | ICD-10-CM | POA: Diagnosis not present

## 2018-02-11 DIAGNOSIS — Z87891 Personal history of nicotine dependence: Secondary | ICD-10-CM | POA: Diagnosis not present

## 2018-02-11 DIAGNOSIS — M21372 Foot drop, left foot: Secondary | ICD-10-CM | POA: Diagnosis not present

## 2018-02-12 DIAGNOSIS — Z8673 Personal history of transient ischemic attack (TIA), and cerebral infarction without residual deficits: Secondary | ICD-10-CM | POA: Diagnosis not present

## 2018-02-12 DIAGNOSIS — Z791 Long term (current) use of non-steroidal anti-inflammatories (NSAID): Secondary | ICD-10-CM | POA: Diagnosis not present

## 2018-02-12 DIAGNOSIS — G609 Hereditary and idiopathic neuropathy, unspecified: Secondary | ICD-10-CM | POA: Diagnosis not present

## 2018-02-12 DIAGNOSIS — N400 Enlarged prostate without lower urinary tract symptoms: Secondary | ICD-10-CM | POA: Diagnosis not present

## 2018-02-12 DIAGNOSIS — M21372 Foot drop, left foot: Secondary | ICD-10-CM | POA: Diagnosis not present

## 2018-02-12 DIAGNOSIS — I10 Essential (primary) hypertension: Secondary | ICD-10-CM | POA: Diagnosis not present

## 2018-02-16 DIAGNOSIS — Z8673 Personal history of transient ischemic attack (TIA), and cerebral infarction without residual deficits: Secondary | ICD-10-CM | POA: Diagnosis not present

## 2018-02-16 DIAGNOSIS — N400 Enlarged prostate without lower urinary tract symptoms: Secondary | ICD-10-CM | POA: Diagnosis not present

## 2018-02-16 DIAGNOSIS — Z791 Long term (current) use of non-steroidal anti-inflammatories (NSAID): Secondary | ICD-10-CM | POA: Diagnosis not present

## 2018-02-16 DIAGNOSIS — M21372 Foot drop, left foot: Secondary | ICD-10-CM | POA: Diagnosis not present

## 2018-02-16 DIAGNOSIS — G609 Hereditary and idiopathic neuropathy, unspecified: Secondary | ICD-10-CM | POA: Diagnosis not present

## 2018-02-16 DIAGNOSIS — I10 Essential (primary) hypertension: Secondary | ICD-10-CM | POA: Diagnosis not present

## 2018-02-18 DIAGNOSIS — N400 Enlarged prostate without lower urinary tract symptoms: Secondary | ICD-10-CM | POA: Diagnosis not present

## 2018-02-18 DIAGNOSIS — Z791 Long term (current) use of non-steroidal anti-inflammatories (NSAID): Secondary | ICD-10-CM | POA: Diagnosis not present

## 2018-02-18 DIAGNOSIS — Z8673 Personal history of transient ischemic attack (TIA), and cerebral infarction without residual deficits: Secondary | ICD-10-CM | POA: Diagnosis not present

## 2018-02-18 DIAGNOSIS — I10 Essential (primary) hypertension: Secondary | ICD-10-CM | POA: Diagnosis not present

## 2018-02-18 DIAGNOSIS — G609 Hereditary and idiopathic neuropathy, unspecified: Secondary | ICD-10-CM | POA: Diagnosis not present

## 2018-02-18 DIAGNOSIS — M21372 Foot drop, left foot: Secondary | ICD-10-CM | POA: Diagnosis not present

## 2018-02-23 DIAGNOSIS — N138 Other obstructive and reflux uropathy: Secondary | ICD-10-CM | POA: Diagnosis not present

## 2018-02-23 DIAGNOSIS — N401 Enlarged prostate with lower urinary tract symptoms: Secondary | ICD-10-CM | POA: Diagnosis not present

## 2018-02-23 DIAGNOSIS — E785 Hyperlipidemia, unspecified: Secondary | ICD-10-CM | POA: Diagnosis not present

## 2018-02-23 DIAGNOSIS — Z6827 Body mass index (BMI) 27.0-27.9, adult: Secondary | ICD-10-CM | POA: Diagnosis not present

## 2018-02-25 DIAGNOSIS — N400 Enlarged prostate without lower urinary tract symptoms: Secondary | ICD-10-CM | POA: Diagnosis not present

## 2018-02-25 DIAGNOSIS — M21372 Foot drop, left foot: Secondary | ICD-10-CM | POA: Diagnosis not present

## 2018-02-25 DIAGNOSIS — I10 Essential (primary) hypertension: Secondary | ICD-10-CM | POA: Diagnosis not present

## 2018-02-25 DIAGNOSIS — Z791 Long term (current) use of non-steroidal anti-inflammatories (NSAID): Secondary | ICD-10-CM | POA: Diagnosis not present

## 2018-02-25 DIAGNOSIS — Z8673 Personal history of transient ischemic attack (TIA), and cerebral infarction without residual deficits: Secondary | ICD-10-CM | POA: Diagnosis not present

## 2018-02-25 DIAGNOSIS — G609 Hereditary and idiopathic neuropathy, unspecified: Secondary | ICD-10-CM | POA: Diagnosis not present

## 2018-03-02 DIAGNOSIS — G609 Hereditary and idiopathic neuropathy, unspecified: Secondary | ICD-10-CM | POA: Diagnosis not present

## 2018-03-04 DIAGNOSIS — M21372 Foot drop, left foot: Secondary | ICD-10-CM | POA: Diagnosis not present

## 2018-03-04 DIAGNOSIS — Z8673 Personal history of transient ischemic attack (TIA), and cerebral infarction without residual deficits: Secondary | ICD-10-CM | POA: Diagnosis not present

## 2018-03-04 DIAGNOSIS — Z791 Long term (current) use of non-steroidal anti-inflammatories (NSAID): Secondary | ICD-10-CM | POA: Diagnosis not present

## 2018-03-04 DIAGNOSIS — G609 Hereditary and idiopathic neuropathy, unspecified: Secondary | ICD-10-CM | POA: Diagnosis not present

## 2018-03-04 DIAGNOSIS — N400 Enlarged prostate without lower urinary tract symptoms: Secondary | ICD-10-CM | POA: Diagnosis not present

## 2018-03-04 DIAGNOSIS — I10 Essential (primary) hypertension: Secondary | ICD-10-CM | POA: Diagnosis not present

## 2018-03-11 DIAGNOSIS — I10 Essential (primary) hypertension: Secondary | ICD-10-CM | POA: Diagnosis not present

## 2018-03-11 DIAGNOSIS — Z791 Long term (current) use of non-steroidal anti-inflammatories (NSAID): Secondary | ICD-10-CM | POA: Diagnosis not present

## 2018-03-11 DIAGNOSIS — Z8673 Personal history of transient ischemic attack (TIA), and cerebral infarction without residual deficits: Secondary | ICD-10-CM | POA: Diagnosis not present

## 2018-03-11 DIAGNOSIS — N400 Enlarged prostate without lower urinary tract symptoms: Secondary | ICD-10-CM | POA: Diagnosis not present

## 2018-03-11 DIAGNOSIS — G609 Hereditary and idiopathic neuropathy, unspecified: Secondary | ICD-10-CM | POA: Diagnosis not present

## 2018-03-11 DIAGNOSIS — M21372 Foot drop, left foot: Secondary | ICD-10-CM | POA: Diagnosis not present

## 2018-03-25 DIAGNOSIS — G609 Hereditary and idiopathic neuropathy, unspecified: Secondary | ICD-10-CM | POA: Diagnosis not present

## 2018-03-25 DIAGNOSIS — M21372 Foot drop, left foot: Secondary | ICD-10-CM | POA: Diagnosis not present

## 2018-03-25 DIAGNOSIS — Z791 Long term (current) use of non-steroidal anti-inflammatories (NSAID): Secondary | ICD-10-CM | POA: Diagnosis not present

## 2018-03-25 DIAGNOSIS — Z8673 Personal history of transient ischemic attack (TIA), and cerebral infarction without residual deficits: Secondary | ICD-10-CM | POA: Diagnosis not present

## 2018-03-25 DIAGNOSIS — N400 Enlarged prostate without lower urinary tract symptoms: Secondary | ICD-10-CM | POA: Diagnosis not present

## 2018-03-25 DIAGNOSIS — I10 Essential (primary) hypertension: Secondary | ICD-10-CM | POA: Diagnosis not present

## 2018-04-08 DIAGNOSIS — Z791 Long term (current) use of non-steroidal anti-inflammatories (NSAID): Secondary | ICD-10-CM | POA: Diagnosis not present

## 2018-04-08 DIAGNOSIS — N400 Enlarged prostate without lower urinary tract symptoms: Secondary | ICD-10-CM | POA: Diagnosis not present

## 2018-04-08 DIAGNOSIS — I10 Essential (primary) hypertension: Secondary | ICD-10-CM | POA: Diagnosis not present

## 2018-04-08 DIAGNOSIS — M21372 Foot drop, left foot: Secondary | ICD-10-CM | POA: Diagnosis not present

## 2018-04-08 DIAGNOSIS — G609 Hereditary and idiopathic neuropathy, unspecified: Secondary | ICD-10-CM | POA: Diagnosis not present

## 2018-04-08 DIAGNOSIS — Z8673 Personal history of transient ischemic attack (TIA), and cerebral infarction without residual deficits: Secondary | ICD-10-CM | POA: Diagnosis not present

## 2018-05-19 DIAGNOSIS — Z23 Encounter for immunization: Secondary | ICD-10-CM | POA: Diagnosis not present

## 2018-10-05 DIAGNOSIS — Z0000 Encounter for general adult medical examination without abnormal findings: Secondary | ICD-10-CM | POA: Diagnosis not present

## 2018-10-05 DIAGNOSIS — N401 Enlarged prostate with lower urinary tract symptoms: Secondary | ICD-10-CM | POA: Diagnosis not present

## 2018-10-05 DIAGNOSIS — I10 Essential (primary) hypertension: Secondary | ICD-10-CM | POA: Diagnosis not present

## 2018-10-05 DIAGNOSIS — Z79899 Other long term (current) drug therapy: Secondary | ICD-10-CM | POA: Diagnosis not present

## 2018-10-05 DIAGNOSIS — Z6827 Body mass index (BMI) 27.0-27.9, adult: Secondary | ICD-10-CM | POA: Diagnosis not present

## 2018-10-05 DIAGNOSIS — E785 Hyperlipidemia, unspecified: Secondary | ICD-10-CM | POA: Diagnosis not present

## 2019-02-28 DIAGNOSIS — I10 Essential (primary) hypertension: Secondary | ICD-10-CM | POA: Diagnosis not present

## 2019-02-28 DIAGNOSIS — E785 Hyperlipidemia, unspecified: Secondary | ICD-10-CM | POA: Diagnosis not present

## 2019-02-28 DIAGNOSIS — Z1331 Encounter for screening for depression: Secondary | ICD-10-CM | POA: Diagnosis not present

## 2019-02-28 DIAGNOSIS — Z79899 Other long term (current) drug therapy: Secondary | ICD-10-CM | POA: Diagnosis not present

## 2019-02-28 DIAGNOSIS — Z6827 Body mass index (BMI) 27.0-27.9, adult: Secondary | ICD-10-CM | POA: Diagnosis not present

## 2019-03-28 DIAGNOSIS — H5203 Hypermetropia, bilateral: Secondary | ICD-10-CM | POA: Diagnosis not present

## 2019-03-28 DIAGNOSIS — H52223 Regular astigmatism, bilateral: Secondary | ICD-10-CM | POA: Diagnosis not present

## 2019-03-28 DIAGNOSIS — H353131 Nonexudative age-related macular degeneration, bilateral, early dry stage: Secondary | ICD-10-CM | POA: Diagnosis not present

## 2019-03-28 DIAGNOSIS — H3530 Unspecified macular degeneration: Secondary | ICD-10-CM | POA: Diagnosis not present

## 2019-03-28 DIAGNOSIS — H25813 Combined forms of age-related cataract, bilateral: Secondary | ICD-10-CM | POA: Diagnosis not present

## 2019-03-28 DIAGNOSIS — H524 Presbyopia: Secondary | ICD-10-CM | POA: Diagnosis not present

## 2019-06-06 DIAGNOSIS — Z23 Encounter for immunization: Secondary | ICD-10-CM | POA: Diagnosis not present

## 2019-06-28 DIAGNOSIS — B351 Tinea unguium: Secondary | ICD-10-CM | POA: Diagnosis not present

## 2019-06-28 DIAGNOSIS — M79676 Pain in unspecified toe(s): Secondary | ICD-10-CM | POA: Diagnosis not present

## 2019-08-17 DIAGNOSIS — R509 Fever, unspecified: Secondary | ICD-10-CM | POA: Diagnosis not present

## 2019-08-18 DIAGNOSIS — D696 Thrombocytopenia, unspecified: Secondary | ICD-10-CM

## 2019-08-18 DIAGNOSIS — R410 Disorientation, unspecified: Secondary | ICD-10-CM

## 2019-08-18 DIAGNOSIS — N179 Acute kidney failure, unspecified: Secondary | ICD-10-CM

## 2019-08-18 DIAGNOSIS — R531 Weakness: Secondary | ICD-10-CM

## 2019-08-18 DIAGNOSIS — J189 Pneumonia, unspecified organism: Secondary | ICD-10-CM

## 2019-08-18 DIAGNOSIS — I10 Essential (primary) hypertension: Secondary | ICD-10-CM

## 2019-08-18 DIAGNOSIS — J690 Pneumonitis due to inhalation of food and vomit: Secondary | ICD-10-CM | POA: Diagnosis not present

## 2019-08-18 DIAGNOSIS — N401 Enlarged prostate with lower urinary tract symptoms: Secondary | ICD-10-CM | POA: Diagnosis not present

## 2019-08-18 DIAGNOSIS — R509 Fever, unspecified: Secondary | ICD-10-CM

## 2019-08-19 DIAGNOSIS — N179 Acute kidney failure, unspecified: Secondary | ICD-10-CM | POA: Diagnosis not present

## 2019-08-19 DIAGNOSIS — D696 Thrombocytopenia, unspecified: Secondary | ICD-10-CM | POA: Diagnosis not present

## 2019-08-19 DIAGNOSIS — R410 Disorientation, unspecified: Secondary | ICD-10-CM | POA: Diagnosis not present

## 2019-08-19 DIAGNOSIS — R531 Weakness: Secondary | ICD-10-CM | POA: Diagnosis not present

## 2019-08-19 DIAGNOSIS — R509 Fever, unspecified: Secondary | ICD-10-CM | POA: Diagnosis not present

## 2019-08-19 DIAGNOSIS — I10 Essential (primary) hypertension: Secondary | ICD-10-CM | POA: Diagnosis not present

## 2019-08-19 DIAGNOSIS — J189 Pneumonia, unspecified organism: Secondary | ICD-10-CM | POA: Diagnosis not present

## 2019-08-20 DIAGNOSIS — N179 Acute kidney failure, unspecified: Secondary | ICD-10-CM | POA: Diagnosis not present

## 2019-08-20 DIAGNOSIS — I10 Essential (primary) hypertension: Secondary | ICD-10-CM | POA: Diagnosis not present

## 2019-08-20 DIAGNOSIS — R531 Weakness: Secondary | ICD-10-CM | POA: Diagnosis not present

## 2019-08-20 DIAGNOSIS — J189 Pneumonia, unspecified organism: Secondary | ICD-10-CM | POA: Diagnosis not present

## 2019-08-20 DIAGNOSIS — R509 Fever, unspecified: Secondary | ICD-10-CM | POA: Diagnosis not present

## 2019-08-20 DIAGNOSIS — R410 Disorientation, unspecified: Secondary | ICD-10-CM | POA: Diagnosis not present

## 2019-08-20 DIAGNOSIS — D696 Thrombocytopenia, unspecified: Secondary | ICD-10-CM | POA: Diagnosis not present

## 2019-08-21 DIAGNOSIS — R531 Weakness: Secondary | ICD-10-CM | POA: Diagnosis not present

## 2019-08-21 DIAGNOSIS — R410 Disorientation, unspecified: Secondary | ICD-10-CM | POA: Diagnosis not present

## 2019-08-21 DIAGNOSIS — N179 Acute kidney failure, unspecified: Secondary | ICD-10-CM | POA: Diagnosis not present

## 2019-08-21 DIAGNOSIS — I10 Essential (primary) hypertension: Secondary | ICD-10-CM | POA: Diagnosis not present

## 2019-08-21 DIAGNOSIS — J189 Pneumonia, unspecified organism: Secondary | ICD-10-CM | POA: Diagnosis not present

## 2019-08-21 DIAGNOSIS — D696 Thrombocytopenia, unspecified: Secondary | ICD-10-CM | POA: Diagnosis not present

## 2019-08-21 DIAGNOSIS — R509 Fever, unspecified: Secondary | ICD-10-CM | POA: Diagnosis not present

## 2019-08-22 DIAGNOSIS — I10 Essential (primary) hypertension: Secondary | ICD-10-CM | POA: Diagnosis not present

## 2019-08-22 DIAGNOSIS — N179 Acute kidney failure, unspecified: Secondary | ICD-10-CM | POA: Diagnosis not present

## 2019-08-22 DIAGNOSIS — R531 Weakness: Secondary | ICD-10-CM | POA: Diagnosis not present

## 2019-08-22 DIAGNOSIS — D696 Thrombocytopenia, unspecified: Secondary | ICD-10-CM | POA: Diagnosis not present

## 2019-08-22 DIAGNOSIS — R509 Fever, unspecified: Secondary | ICD-10-CM | POA: Diagnosis not present

## 2019-08-22 DIAGNOSIS — R410 Disorientation, unspecified: Secondary | ICD-10-CM | POA: Diagnosis not present

## 2019-08-22 DIAGNOSIS — J189 Pneumonia, unspecified organism: Secondary | ICD-10-CM | POA: Diagnosis not present

## 2019-08-23 DIAGNOSIS — R509 Fever, unspecified: Secondary | ICD-10-CM | POA: Diagnosis not present

## 2019-08-23 DIAGNOSIS — R531 Weakness: Secondary | ICD-10-CM | POA: Diagnosis not present

## 2019-08-23 DIAGNOSIS — R410 Disorientation, unspecified: Secondary | ICD-10-CM | POA: Diagnosis not present

## 2019-08-23 DIAGNOSIS — D696 Thrombocytopenia, unspecified: Secondary | ICD-10-CM | POA: Diagnosis not present

## 2019-08-23 DIAGNOSIS — J189 Pneumonia, unspecified organism: Secondary | ICD-10-CM | POA: Diagnosis not present

## 2019-08-23 DIAGNOSIS — I10 Essential (primary) hypertension: Secondary | ICD-10-CM | POA: Diagnosis not present

## 2019-08-23 DIAGNOSIS — N179 Acute kidney failure, unspecified: Secondary | ICD-10-CM | POA: Diagnosis not present

## 2019-08-25 DIAGNOSIS — R2681 Unsteadiness on feet: Secondary | ICD-10-CM | POA: Diagnosis not present

## 2019-08-25 DIAGNOSIS — Z7902 Long term (current) use of antithrombotics/antiplatelets: Secondary | ICD-10-CM | POA: Diagnosis not present

## 2019-08-25 DIAGNOSIS — E1140 Type 2 diabetes mellitus with diabetic neuropathy, unspecified: Secondary | ICD-10-CM | POA: Diagnosis not present

## 2019-08-25 DIAGNOSIS — E1122 Type 2 diabetes mellitus with diabetic chronic kidney disease: Secondary | ICD-10-CM | POA: Diagnosis not present

## 2019-08-25 DIAGNOSIS — Z87891 Personal history of nicotine dependence: Secondary | ICD-10-CM | POA: Diagnosis not present

## 2019-08-25 DIAGNOSIS — D696 Thrombocytopenia, unspecified: Secondary | ICD-10-CM | POA: Diagnosis not present

## 2019-08-25 DIAGNOSIS — R1310 Dysphagia, unspecified: Secondary | ICD-10-CM | POA: Diagnosis not present

## 2019-08-25 DIAGNOSIS — Z8701 Personal history of pneumonia (recurrent): Secondary | ICD-10-CM | POA: Diagnosis not present

## 2019-08-25 DIAGNOSIS — I69991 Dysphagia following unspecified cerebrovascular disease: Secondary | ICD-10-CM | POA: Diagnosis not present

## 2019-08-25 DIAGNOSIS — M1990 Unspecified osteoarthritis, unspecified site: Secondary | ICD-10-CM | POA: Diagnosis not present

## 2019-08-25 DIAGNOSIS — Z8673 Personal history of transient ischemic attack (TIA), and cerebral infarction without residual deficits: Secondary | ICD-10-CM | POA: Diagnosis not present

## 2019-08-25 DIAGNOSIS — I129 Hypertensive chronic kidney disease with stage 1 through stage 4 chronic kidney disease, or unspecified chronic kidney disease: Secondary | ICD-10-CM | POA: Diagnosis not present

## 2019-08-25 DIAGNOSIS — I69920 Aphasia following unspecified cerebrovascular disease: Secondary | ICD-10-CM | POA: Diagnosis not present

## 2019-08-25 DIAGNOSIS — F0390 Unspecified dementia without behavioral disturbance: Secondary | ICD-10-CM | POA: Diagnosis not present

## 2019-08-25 DIAGNOSIS — M21372 Foot drop, left foot: Secondary | ICD-10-CM | POA: Diagnosis not present

## 2019-08-25 DIAGNOSIS — Z79899 Other long term (current) drug therapy: Secondary | ICD-10-CM | POA: Diagnosis not present

## 2019-08-25 DIAGNOSIS — J690 Pneumonitis due to inhalation of food and vomit: Secondary | ICD-10-CM | POA: Diagnosis not present

## 2019-08-25 DIAGNOSIS — E7800 Pure hypercholesterolemia, unspecified: Secondary | ICD-10-CM | POA: Diagnosis not present

## 2019-08-25 DIAGNOSIS — N182 Chronic kidney disease, stage 2 (mild): Secondary | ICD-10-CM | POA: Diagnosis not present

## 2019-08-26 DIAGNOSIS — I69920 Aphasia following unspecified cerebrovascular disease: Secondary | ICD-10-CM | POA: Diagnosis not present

## 2019-08-26 DIAGNOSIS — N182 Chronic kidney disease, stage 2 (mild): Secondary | ICD-10-CM | POA: Diagnosis not present

## 2019-08-26 DIAGNOSIS — I129 Hypertensive chronic kidney disease with stage 1 through stage 4 chronic kidney disease, or unspecified chronic kidney disease: Secondary | ICD-10-CM | POA: Diagnosis not present

## 2019-08-26 DIAGNOSIS — I69991 Dysphagia following unspecified cerebrovascular disease: Secondary | ICD-10-CM | POA: Diagnosis not present

## 2019-08-26 DIAGNOSIS — E1140 Type 2 diabetes mellitus with diabetic neuropathy, unspecified: Secondary | ICD-10-CM | POA: Diagnosis not present

## 2019-08-26 DIAGNOSIS — J690 Pneumonitis due to inhalation of food and vomit: Secondary | ICD-10-CM | POA: Diagnosis not present

## 2019-08-29 DIAGNOSIS — I69991 Dysphagia following unspecified cerebrovascular disease: Secondary | ICD-10-CM | POA: Diagnosis not present

## 2019-08-29 DIAGNOSIS — I69920 Aphasia following unspecified cerebrovascular disease: Secondary | ICD-10-CM | POA: Diagnosis not present

## 2019-08-29 DIAGNOSIS — E1140 Type 2 diabetes mellitus with diabetic neuropathy, unspecified: Secondary | ICD-10-CM | POA: Diagnosis not present

## 2019-08-29 DIAGNOSIS — I129 Hypertensive chronic kidney disease with stage 1 through stage 4 chronic kidney disease, or unspecified chronic kidney disease: Secondary | ICD-10-CM | POA: Diagnosis not present

## 2019-08-29 DIAGNOSIS — N182 Chronic kidney disease, stage 2 (mild): Secondary | ICD-10-CM | POA: Diagnosis not present

## 2019-08-29 DIAGNOSIS — J690 Pneumonitis due to inhalation of food and vomit: Secondary | ICD-10-CM | POA: Diagnosis not present

## 2019-08-30 DIAGNOSIS — J690 Pneumonitis due to inhalation of food and vomit: Secondary | ICD-10-CM | POA: Diagnosis not present

## 2019-08-30 DIAGNOSIS — I69920 Aphasia following unspecified cerebrovascular disease: Secondary | ICD-10-CM | POA: Diagnosis not present

## 2019-08-30 DIAGNOSIS — I129 Hypertensive chronic kidney disease with stage 1 through stage 4 chronic kidney disease, or unspecified chronic kidney disease: Secondary | ICD-10-CM | POA: Diagnosis not present

## 2019-08-30 DIAGNOSIS — E1140 Type 2 diabetes mellitus with diabetic neuropathy, unspecified: Secondary | ICD-10-CM | POA: Diagnosis not present

## 2019-08-30 DIAGNOSIS — I69991 Dysphagia following unspecified cerebrovascular disease: Secondary | ICD-10-CM | POA: Diagnosis not present

## 2019-08-30 DIAGNOSIS — N182 Chronic kidney disease, stage 2 (mild): Secondary | ICD-10-CM | POA: Diagnosis not present

## 2019-09-01 DIAGNOSIS — I129 Hypertensive chronic kidney disease with stage 1 through stage 4 chronic kidney disease, or unspecified chronic kidney disease: Secondary | ICD-10-CM | POA: Diagnosis not present

## 2019-09-01 DIAGNOSIS — E1140 Type 2 diabetes mellitus with diabetic neuropathy, unspecified: Secondary | ICD-10-CM | POA: Diagnosis not present

## 2019-09-01 DIAGNOSIS — J690 Pneumonitis due to inhalation of food and vomit: Secondary | ICD-10-CM | POA: Diagnosis not present

## 2019-09-01 DIAGNOSIS — N182 Chronic kidney disease, stage 2 (mild): Secondary | ICD-10-CM | POA: Diagnosis not present

## 2019-09-01 DIAGNOSIS — I69920 Aphasia following unspecified cerebrovascular disease: Secondary | ICD-10-CM | POA: Diagnosis not present

## 2019-09-01 DIAGNOSIS — I69991 Dysphagia following unspecified cerebrovascular disease: Secondary | ICD-10-CM | POA: Diagnosis not present

## 2019-09-02 DIAGNOSIS — I69920 Aphasia following unspecified cerebrovascular disease: Secondary | ICD-10-CM | POA: Diagnosis not present

## 2019-09-02 DIAGNOSIS — N182 Chronic kidney disease, stage 2 (mild): Secondary | ICD-10-CM | POA: Diagnosis not present

## 2019-09-02 DIAGNOSIS — J690 Pneumonitis due to inhalation of food and vomit: Secondary | ICD-10-CM | POA: Diagnosis not present

## 2019-09-02 DIAGNOSIS — I69991 Dysphagia following unspecified cerebrovascular disease: Secondary | ICD-10-CM | POA: Diagnosis not present

## 2019-09-02 DIAGNOSIS — I129 Hypertensive chronic kidney disease with stage 1 through stage 4 chronic kidney disease, or unspecified chronic kidney disease: Secondary | ICD-10-CM | POA: Diagnosis not present

## 2019-09-02 DIAGNOSIS — E1140 Type 2 diabetes mellitus with diabetic neuropathy, unspecified: Secondary | ICD-10-CM | POA: Diagnosis not present

## 2019-09-05 DIAGNOSIS — E1140 Type 2 diabetes mellitus with diabetic neuropathy, unspecified: Secondary | ICD-10-CM | POA: Diagnosis not present

## 2019-09-05 DIAGNOSIS — I129 Hypertensive chronic kidney disease with stage 1 through stage 4 chronic kidney disease, or unspecified chronic kidney disease: Secondary | ICD-10-CM | POA: Diagnosis not present

## 2019-09-05 DIAGNOSIS — I69920 Aphasia following unspecified cerebrovascular disease: Secondary | ICD-10-CM | POA: Diagnosis not present

## 2019-09-05 DIAGNOSIS — N182 Chronic kidney disease, stage 2 (mild): Secondary | ICD-10-CM | POA: Diagnosis not present

## 2019-09-05 DIAGNOSIS — I69991 Dysphagia following unspecified cerebrovascular disease: Secondary | ICD-10-CM | POA: Diagnosis not present

## 2019-09-05 DIAGNOSIS — J690 Pneumonitis due to inhalation of food and vomit: Secondary | ICD-10-CM | POA: Diagnosis not present

## 2019-09-07 DIAGNOSIS — J690 Pneumonitis due to inhalation of food and vomit: Secondary | ICD-10-CM | POA: Diagnosis not present

## 2019-09-07 DIAGNOSIS — E1140 Type 2 diabetes mellitus with diabetic neuropathy, unspecified: Secondary | ICD-10-CM | POA: Diagnosis not present

## 2019-09-07 DIAGNOSIS — I69920 Aphasia following unspecified cerebrovascular disease: Secondary | ICD-10-CM | POA: Diagnosis not present

## 2019-09-07 DIAGNOSIS — N182 Chronic kidney disease, stage 2 (mild): Secondary | ICD-10-CM | POA: Diagnosis not present

## 2019-09-07 DIAGNOSIS — I69991 Dysphagia following unspecified cerebrovascular disease: Secondary | ICD-10-CM | POA: Diagnosis not present

## 2019-09-07 DIAGNOSIS — I129 Hypertensive chronic kidney disease with stage 1 through stage 4 chronic kidney disease, or unspecified chronic kidney disease: Secondary | ICD-10-CM | POA: Diagnosis not present

## 2019-09-08 DIAGNOSIS — E1140 Type 2 diabetes mellitus with diabetic neuropathy, unspecified: Secondary | ICD-10-CM | POA: Diagnosis not present

## 2019-09-08 DIAGNOSIS — I69991 Dysphagia following unspecified cerebrovascular disease: Secondary | ICD-10-CM | POA: Diagnosis not present

## 2019-09-08 DIAGNOSIS — N182 Chronic kidney disease, stage 2 (mild): Secondary | ICD-10-CM | POA: Diagnosis not present

## 2019-09-08 DIAGNOSIS — J690 Pneumonitis due to inhalation of food and vomit: Secondary | ICD-10-CM | POA: Diagnosis not present

## 2019-09-08 DIAGNOSIS — I69920 Aphasia following unspecified cerebrovascular disease: Secondary | ICD-10-CM | POA: Diagnosis not present

## 2019-09-08 DIAGNOSIS — I129 Hypertensive chronic kidney disease with stage 1 through stage 4 chronic kidney disease, or unspecified chronic kidney disease: Secondary | ICD-10-CM | POA: Diagnosis not present

## 2019-09-12 DIAGNOSIS — N182 Chronic kidney disease, stage 2 (mild): Secondary | ICD-10-CM | POA: Diagnosis not present

## 2019-09-12 DIAGNOSIS — I129 Hypertensive chronic kidney disease with stage 1 through stage 4 chronic kidney disease, or unspecified chronic kidney disease: Secondary | ICD-10-CM | POA: Diagnosis not present

## 2019-09-12 DIAGNOSIS — J690 Pneumonitis due to inhalation of food and vomit: Secondary | ICD-10-CM | POA: Diagnosis not present

## 2019-09-12 DIAGNOSIS — I69991 Dysphagia following unspecified cerebrovascular disease: Secondary | ICD-10-CM | POA: Diagnosis not present

## 2019-09-12 DIAGNOSIS — I69920 Aphasia following unspecified cerebrovascular disease: Secondary | ICD-10-CM | POA: Diagnosis not present

## 2019-09-12 DIAGNOSIS — E1140 Type 2 diabetes mellitus with diabetic neuropathy, unspecified: Secondary | ICD-10-CM | POA: Diagnosis not present

## 2019-09-16 DIAGNOSIS — I69991 Dysphagia following unspecified cerebrovascular disease: Secondary | ICD-10-CM | POA: Diagnosis not present

## 2019-09-16 DIAGNOSIS — I129 Hypertensive chronic kidney disease with stage 1 through stage 4 chronic kidney disease, or unspecified chronic kidney disease: Secondary | ICD-10-CM | POA: Diagnosis not present

## 2019-09-16 DIAGNOSIS — N182 Chronic kidney disease, stage 2 (mild): Secondary | ICD-10-CM | POA: Diagnosis not present

## 2019-09-16 DIAGNOSIS — J690 Pneumonitis due to inhalation of food and vomit: Secondary | ICD-10-CM | POA: Diagnosis not present

## 2019-09-16 DIAGNOSIS — E1140 Type 2 diabetes mellitus with diabetic neuropathy, unspecified: Secondary | ICD-10-CM | POA: Diagnosis not present

## 2019-09-16 DIAGNOSIS — I69920 Aphasia following unspecified cerebrovascular disease: Secondary | ICD-10-CM | POA: Diagnosis not present

## 2019-09-19 DIAGNOSIS — J690 Pneumonitis due to inhalation of food and vomit: Secondary | ICD-10-CM | POA: Diagnosis not present

## 2019-09-19 DIAGNOSIS — N182 Chronic kidney disease, stage 2 (mild): Secondary | ICD-10-CM | POA: Diagnosis not present

## 2019-09-19 DIAGNOSIS — E1140 Type 2 diabetes mellitus with diabetic neuropathy, unspecified: Secondary | ICD-10-CM | POA: Diagnosis not present

## 2019-09-19 DIAGNOSIS — I69920 Aphasia following unspecified cerebrovascular disease: Secondary | ICD-10-CM | POA: Diagnosis not present

## 2019-09-19 DIAGNOSIS — I129 Hypertensive chronic kidney disease with stage 1 through stage 4 chronic kidney disease, or unspecified chronic kidney disease: Secondary | ICD-10-CM | POA: Diagnosis not present

## 2019-09-19 DIAGNOSIS — I69991 Dysphagia following unspecified cerebrovascular disease: Secondary | ICD-10-CM | POA: Diagnosis not present

## 2019-09-20 DIAGNOSIS — I69991 Dysphagia following unspecified cerebrovascular disease: Secondary | ICD-10-CM | POA: Diagnosis not present

## 2019-09-20 DIAGNOSIS — E1140 Type 2 diabetes mellitus with diabetic neuropathy, unspecified: Secondary | ICD-10-CM | POA: Diagnosis not present

## 2019-09-20 DIAGNOSIS — J690 Pneumonitis due to inhalation of food and vomit: Secondary | ICD-10-CM | POA: Diagnosis not present

## 2019-09-20 DIAGNOSIS — N182 Chronic kidney disease, stage 2 (mild): Secondary | ICD-10-CM | POA: Diagnosis not present

## 2019-09-20 DIAGNOSIS — I129 Hypertensive chronic kidney disease with stage 1 through stage 4 chronic kidney disease, or unspecified chronic kidney disease: Secondary | ICD-10-CM | POA: Diagnosis not present

## 2019-09-20 DIAGNOSIS — I69920 Aphasia following unspecified cerebrovascular disease: Secondary | ICD-10-CM | POA: Diagnosis not present

## 2019-09-22 DIAGNOSIS — E1140 Type 2 diabetes mellitus with diabetic neuropathy, unspecified: Secondary | ICD-10-CM | POA: Diagnosis not present

## 2019-09-22 DIAGNOSIS — I69991 Dysphagia following unspecified cerebrovascular disease: Secondary | ICD-10-CM | POA: Diagnosis not present

## 2019-09-22 DIAGNOSIS — I69920 Aphasia following unspecified cerebrovascular disease: Secondary | ICD-10-CM | POA: Diagnosis not present

## 2019-09-22 DIAGNOSIS — J690 Pneumonitis due to inhalation of food and vomit: Secondary | ICD-10-CM | POA: Diagnosis not present

## 2019-09-22 DIAGNOSIS — N182 Chronic kidney disease, stage 2 (mild): Secondary | ICD-10-CM | POA: Diagnosis not present

## 2019-09-22 DIAGNOSIS — I129 Hypertensive chronic kidney disease with stage 1 through stage 4 chronic kidney disease, or unspecified chronic kidney disease: Secondary | ICD-10-CM | POA: Diagnosis not present

## 2019-09-24 DIAGNOSIS — M1990 Unspecified osteoarthritis, unspecified site: Secondary | ICD-10-CM | POA: Diagnosis not present

## 2019-09-24 DIAGNOSIS — Z7902 Long term (current) use of antithrombotics/antiplatelets: Secondary | ICD-10-CM | POA: Diagnosis not present

## 2019-09-24 DIAGNOSIS — I69991 Dysphagia following unspecified cerebrovascular disease: Secondary | ICD-10-CM | POA: Diagnosis not present

## 2019-09-24 DIAGNOSIS — E7800 Pure hypercholesterolemia, unspecified: Secondary | ICD-10-CM | POA: Diagnosis not present

## 2019-09-24 DIAGNOSIS — R2681 Unsteadiness on feet: Secondary | ICD-10-CM | POA: Diagnosis not present

## 2019-09-24 DIAGNOSIS — N182 Chronic kidney disease, stage 2 (mild): Secondary | ICD-10-CM | POA: Diagnosis not present

## 2019-09-24 DIAGNOSIS — R1310 Dysphagia, unspecified: Secondary | ICD-10-CM | POA: Diagnosis not present

## 2019-09-24 DIAGNOSIS — F0390 Unspecified dementia without behavioral disturbance: Secondary | ICD-10-CM | POA: Diagnosis not present

## 2019-09-24 DIAGNOSIS — E1122 Type 2 diabetes mellitus with diabetic chronic kidney disease: Secondary | ICD-10-CM | POA: Diagnosis not present

## 2019-09-24 DIAGNOSIS — Z87891 Personal history of nicotine dependence: Secondary | ICD-10-CM | POA: Diagnosis not present

## 2019-09-24 DIAGNOSIS — E1140 Type 2 diabetes mellitus with diabetic neuropathy, unspecified: Secondary | ICD-10-CM | POA: Diagnosis not present

## 2019-09-24 DIAGNOSIS — Z79899 Other long term (current) drug therapy: Secondary | ICD-10-CM | POA: Diagnosis not present

## 2019-09-24 DIAGNOSIS — Z8701 Personal history of pneumonia (recurrent): Secondary | ICD-10-CM | POA: Diagnosis not present

## 2019-09-24 DIAGNOSIS — D696 Thrombocytopenia, unspecified: Secondary | ICD-10-CM | POA: Diagnosis not present

## 2019-09-24 DIAGNOSIS — Z8673 Personal history of transient ischemic attack (TIA), and cerebral infarction without residual deficits: Secondary | ICD-10-CM | POA: Diagnosis not present

## 2019-09-24 DIAGNOSIS — I129 Hypertensive chronic kidney disease with stage 1 through stage 4 chronic kidney disease, or unspecified chronic kidney disease: Secondary | ICD-10-CM | POA: Diagnosis not present

## 2019-09-24 DIAGNOSIS — J690 Pneumonitis due to inhalation of food and vomit: Secondary | ICD-10-CM | POA: Diagnosis not present

## 2019-09-24 DIAGNOSIS — I69920 Aphasia following unspecified cerebrovascular disease: Secondary | ICD-10-CM | POA: Diagnosis not present

## 2019-09-24 DIAGNOSIS — M21372 Foot drop, left foot: Secondary | ICD-10-CM | POA: Diagnosis not present

## 2019-09-27 DIAGNOSIS — I69920 Aphasia following unspecified cerebrovascular disease: Secondary | ICD-10-CM | POA: Diagnosis not present

## 2019-09-27 DIAGNOSIS — J690 Pneumonitis due to inhalation of food and vomit: Secondary | ICD-10-CM | POA: Diagnosis not present

## 2019-09-27 DIAGNOSIS — I69991 Dysphagia following unspecified cerebrovascular disease: Secondary | ICD-10-CM | POA: Diagnosis not present

## 2019-09-27 DIAGNOSIS — N182 Chronic kidney disease, stage 2 (mild): Secondary | ICD-10-CM | POA: Diagnosis not present

## 2019-09-27 DIAGNOSIS — I129 Hypertensive chronic kidney disease with stage 1 through stage 4 chronic kidney disease, or unspecified chronic kidney disease: Secondary | ICD-10-CM | POA: Diagnosis not present

## 2019-09-27 DIAGNOSIS — E1140 Type 2 diabetes mellitus with diabetic neuropathy, unspecified: Secondary | ICD-10-CM | POA: Diagnosis not present

## 2019-10-06 DIAGNOSIS — E1140 Type 2 diabetes mellitus with diabetic neuropathy, unspecified: Secondary | ICD-10-CM | POA: Diagnosis not present

## 2019-10-06 DIAGNOSIS — J690 Pneumonitis due to inhalation of food and vomit: Secondary | ICD-10-CM | POA: Diagnosis not present

## 2019-10-06 DIAGNOSIS — I129 Hypertensive chronic kidney disease with stage 1 through stage 4 chronic kidney disease, or unspecified chronic kidney disease: Secondary | ICD-10-CM | POA: Diagnosis not present

## 2019-10-06 DIAGNOSIS — N182 Chronic kidney disease, stage 2 (mild): Secondary | ICD-10-CM | POA: Diagnosis not present

## 2019-10-06 DIAGNOSIS — I69991 Dysphagia following unspecified cerebrovascular disease: Secondary | ICD-10-CM | POA: Diagnosis not present

## 2019-10-06 DIAGNOSIS — I69920 Aphasia following unspecified cerebrovascular disease: Secondary | ICD-10-CM | POA: Diagnosis not present

## 2019-10-11 DIAGNOSIS — I69920 Aphasia following unspecified cerebrovascular disease: Secondary | ICD-10-CM | POA: Diagnosis not present

## 2019-10-11 DIAGNOSIS — E1140 Type 2 diabetes mellitus with diabetic neuropathy, unspecified: Secondary | ICD-10-CM | POA: Diagnosis not present

## 2019-10-11 DIAGNOSIS — I69991 Dysphagia following unspecified cerebrovascular disease: Secondary | ICD-10-CM | POA: Diagnosis not present

## 2019-10-11 DIAGNOSIS — N182 Chronic kidney disease, stage 2 (mild): Secondary | ICD-10-CM | POA: Diagnosis not present

## 2019-10-11 DIAGNOSIS — J690 Pneumonitis due to inhalation of food and vomit: Secondary | ICD-10-CM | POA: Diagnosis not present

## 2019-10-11 DIAGNOSIS — I129 Hypertensive chronic kidney disease with stage 1 through stage 4 chronic kidney disease, or unspecified chronic kidney disease: Secondary | ICD-10-CM | POA: Diagnosis not present

## 2019-10-19 DIAGNOSIS — Z1331 Encounter for screening for depression: Secondary | ICD-10-CM | POA: Diagnosis not present

## 2019-10-19 DIAGNOSIS — L0390 Cellulitis, unspecified: Secondary | ICD-10-CM | POA: Diagnosis not present

## 2019-10-19 DIAGNOSIS — Z0000 Encounter for general adult medical examination without abnormal findings: Secondary | ICD-10-CM | POA: Diagnosis not present

## 2019-10-19 DIAGNOSIS — Z6827 Body mass index (BMI) 27.0-27.9, adult: Secondary | ICD-10-CM | POA: Diagnosis not present

## 2019-10-20 DIAGNOSIS — I69920 Aphasia following unspecified cerebrovascular disease: Secondary | ICD-10-CM | POA: Diagnosis not present

## 2019-10-20 DIAGNOSIS — I69991 Dysphagia following unspecified cerebrovascular disease: Secondary | ICD-10-CM | POA: Diagnosis not present

## 2019-10-20 DIAGNOSIS — N182 Chronic kidney disease, stage 2 (mild): Secondary | ICD-10-CM | POA: Diagnosis not present

## 2019-10-20 DIAGNOSIS — E1140 Type 2 diabetes mellitus with diabetic neuropathy, unspecified: Secondary | ICD-10-CM | POA: Diagnosis not present

## 2019-10-20 DIAGNOSIS — I129 Hypertensive chronic kidney disease with stage 1 through stage 4 chronic kidney disease, or unspecified chronic kidney disease: Secondary | ICD-10-CM | POA: Diagnosis not present

## 2019-10-20 DIAGNOSIS — J690 Pneumonitis due to inhalation of food and vomit: Secondary | ICD-10-CM | POA: Diagnosis not present

## 2019-10-24 DIAGNOSIS — E7800 Pure hypercholesterolemia, unspecified: Secondary | ICD-10-CM | POA: Diagnosis not present

## 2019-10-24 DIAGNOSIS — Z8673 Personal history of transient ischemic attack (TIA), and cerebral infarction without residual deficits: Secondary | ICD-10-CM | POA: Diagnosis not present

## 2019-10-24 DIAGNOSIS — Z7902 Long term (current) use of antithrombotics/antiplatelets: Secondary | ICD-10-CM | POA: Diagnosis not present

## 2019-10-24 DIAGNOSIS — I69920 Aphasia following unspecified cerebrovascular disease: Secondary | ICD-10-CM | POA: Diagnosis not present

## 2019-10-24 DIAGNOSIS — N182 Chronic kidney disease, stage 2 (mild): Secondary | ICD-10-CM | POA: Diagnosis not present

## 2019-10-24 DIAGNOSIS — I129 Hypertensive chronic kidney disease with stage 1 through stage 4 chronic kidney disease, or unspecified chronic kidney disease: Secondary | ICD-10-CM | POA: Diagnosis not present

## 2019-10-24 DIAGNOSIS — R2681 Unsteadiness on feet: Secondary | ICD-10-CM | POA: Diagnosis not present

## 2019-10-24 DIAGNOSIS — M21372 Foot drop, left foot: Secondary | ICD-10-CM | POA: Diagnosis not present

## 2019-10-24 DIAGNOSIS — R1310 Dysphagia, unspecified: Secondary | ICD-10-CM | POA: Diagnosis not present

## 2019-10-24 DIAGNOSIS — Z8701 Personal history of pneumonia (recurrent): Secondary | ICD-10-CM | POA: Diagnosis not present

## 2019-10-24 DIAGNOSIS — F0390 Unspecified dementia without behavioral disturbance: Secondary | ICD-10-CM | POA: Diagnosis not present

## 2019-10-24 DIAGNOSIS — E1122 Type 2 diabetes mellitus with diabetic chronic kidney disease: Secondary | ICD-10-CM | POA: Diagnosis not present

## 2019-10-24 DIAGNOSIS — I69991 Dysphagia following unspecified cerebrovascular disease: Secondary | ICD-10-CM | POA: Diagnosis not present

## 2019-10-24 DIAGNOSIS — N390 Urinary tract infection, site not specified: Secondary | ICD-10-CM | POA: Diagnosis not present

## 2019-10-24 DIAGNOSIS — Z79899 Other long term (current) drug therapy: Secondary | ICD-10-CM | POA: Diagnosis not present

## 2019-10-24 DIAGNOSIS — Z87891 Personal history of nicotine dependence: Secondary | ICD-10-CM | POA: Diagnosis not present

## 2019-10-24 DIAGNOSIS — E1140 Type 2 diabetes mellitus with diabetic neuropathy, unspecified: Secondary | ICD-10-CM | POA: Diagnosis not present

## 2019-10-24 DIAGNOSIS — D696 Thrombocytopenia, unspecified: Secondary | ICD-10-CM | POA: Diagnosis not present

## 2019-10-24 DIAGNOSIS — M1990 Unspecified osteoarthritis, unspecified site: Secondary | ICD-10-CM | POA: Diagnosis not present

## 2019-10-27 DIAGNOSIS — S90112A Contusion of left great toe without damage to nail, initial encounter: Secondary | ICD-10-CM | POA: Diagnosis not present

## 2019-10-27 DIAGNOSIS — I69920 Aphasia following unspecified cerebrovascular disease: Secondary | ICD-10-CM | POA: Diagnosis not present

## 2019-10-27 DIAGNOSIS — N390 Urinary tract infection, site not specified: Secondary | ICD-10-CM | POA: Diagnosis not present

## 2019-10-27 DIAGNOSIS — N182 Chronic kidney disease, stage 2 (mild): Secondary | ICD-10-CM | POA: Diagnosis not present

## 2019-10-27 DIAGNOSIS — I69991 Dysphagia following unspecified cerebrovascular disease: Secondary | ICD-10-CM | POA: Diagnosis not present

## 2019-10-27 DIAGNOSIS — E1140 Type 2 diabetes mellitus with diabetic neuropathy, unspecified: Secondary | ICD-10-CM | POA: Diagnosis not present

## 2019-10-27 DIAGNOSIS — I129 Hypertensive chronic kidney disease with stage 1 through stage 4 chronic kidney disease, or unspecified chronic kidney disease: Secondary | ICD-10-CM | POA: Diagnosis not present

## 2019-10-31 DIAGNOSIS — E1140 Type 2 diabetes mellitus with diabetic neuropathy, unspecified: Secondary | ICD-10-CM | POA: Diagnosis not present

## 2019-10-31 DIAGNOSIS — N390 Urinary tract infection, site not specified: Secondary | ICD-10-CM | POA: Diagnosis not present

## 2019-11-03 DIAGNOSIS — E1140 Type 2 diabetes mellitus with diabetic neuropathy, unspecified: Secondary | ICD-10-CM | POA: Diagnosis not present

## 2019-11-03 DIAGNOSIS — I69991 Dysphagia following unspecified cerebrovascular disease: Secondary | ICD-10-CM | POA: Diagnosis not present

## 2019-11-03 DIAGNOSIS — I69920 Aphasia following unspecified cerebrovascular disease: Secondary | ICD-10-CM | POA: Diagnosis not present

## 2019-11-03 DIAGNOSIS — N182 Chronic kidney disease, stage 2 (mild): Secondary | ICD-10-CM | POA: Diagnosis not present

## 2019-11-03 DIAGNOSIS — N390 Urinary tract infection, site not specified: Secondary | ICD-10-CM | POA: Diagnosis not present

## 2019-11-03 DIAGNOSIS — I129 Hypertensive chronic kidney disease with stage 1 through stage 4 chronic kidney disease, or unspecified chronic kidney disease: Secondary | ICD-10-CM | POA: Diagnosis not present

## 2019-11-10 DIAGNOSIS — N182 Chronic kidney disease, stage 2 (mild): Secondary | ICD-10-CM | POA: Diagnosis not present

## 2019-11-10 DIAGNOSIS — R300 Dysuria: Secondary | ICD-10-CM | POA: Diagnosis not present

## 2019-11-10 DIAGNOSIS — I69920 Aphasia following unspecified cerebrovascular disease: Secondary | ICD-10-CM | POA: Diagnosis not present

## 2019-11-10 DIAGNOSIS — N390 Urinary tract infection, site not specified: Secondary | ICD-10-CM | POA: Diagnosis not present

## 2019-11-10 DIAGNOSIS — I129 Hypertensive chronic kidney disease with stage 1 through stage 4 chronic kidney disease, or unspecified chronic kidney disease: Secondary | ICD-10-CM | POA: Diagnosis not present

## 2019-11-10 DIAGNOSIS — I69991 Dysphagia following unspecified cerebrovascular disease: Secondary | ICD-10-CM | POA: Diagnosis not present

## 2019-11-10 DIAGNOSIS — E1140 Type 2 diabetes mellitus with diabetic neuropathy, unspecified: Secondary | ICD-10-CM | POA: Diagnosis not present

## 2019-11-10 DIAGNOSIS — R509 Fever, unspecified: Secondary | ICD-10-CM | POA: Diagnosis not present

## 2019-11-17 DIAGNOSIS — I69991 Dysphagia following unspecified cerebrovascular disease: Secondary | ICD-10-CM | POA: Diagnosis not present

## 2019-11-17 DIAGNOSIS — N390 Urinary tract infection, site not specified: Secondary | ICD-10-CM | POA: Diagnosis not present

## 2019-11-17 DIAGNOSIS — I129 Hypertensive chronic kidney disease with stage 1 through stage 4 chronic kidney disease, or unspecified chronic kidney disease: Secondary | ICD-10-CM | POA: Diagnosis not present

## 2019-11-17 DIAGNOSIS — I69920 Aphasia following unspecified cerebrovascular disease: Secondary | ICD-10-CM | POA: Diagnosis not present

## 2019-11-17 DIAGNOSIS — N182 Chronic kidney disease, stage 2 (mild): Secondary | ICD-10-CM | POA: Diagnosis not present

## 2019-11-17 DIAGNOSIS — E1140 Type 2 diabetes mellitus with diabetic neuropathy, unspecified: Secondary | ICD-10-CM | POA: Diagnosis not present

## 2019-11-23 DIAGNOSIS — D696 Thrombocytopenia, unspecified: Secondary | ICD-10-CM | POA: Diagnosis not present

## 2019-11-23 DIAGNOSIS — F0390 Unspecified dementia without behavioral disturbance: Secondary | ICD-10-CM | POA: Diagnosis not present

## 2019-11-23 DIAGNOSIS — Z79899 Other long term (current) drug therapy: Secondary | ICD-10-CM | POA: Diagnosis not present

## 2019-11-23 DIAGNOSIS — R1310 Dysphagia, unspecified: Secondary | ICD-10-CM | POA: Diagnosis not present

## 2019-11-23 DIAGNOSIS — M21372 Foot drop, left foot: Secondary | ICD-10-CM | POA: Diagnosis not present

## 2019-11-23 DIAGNOSIS — Z8673 Personal history of transient ischemic attack (TIA), and cerebral infarction without residual deficits: Secondary | ICD-10-CM | POA: Diagnosis not present

## 2019-11-23 DIAGNOSIS — R2681 Unsteadiness on feet: Secondary | ICD-10-CM | POA: Diagnosis not present

## 2019-11-23 DIAGNOSIS — Z87891 Personal history of nicotine dependence: Secondary | ICD-10-CM | POA: Diagnosis not present

## 2019-11-23 DIAGNOSIS — I69991 Dysphagia following unspecified cerebrovascular disease: Secondary | ICD-10-CM | POA: Diagnosis not present

## 2019-11-23 DIAGNOSIS — I129 Hypertensive chronic kidney disease with stage 1 through stage 4 chronic kidney disease, or unspecified chronic kidney disease: Secondary | ICD-10-CM | POA: Diagnosis not present

## 2019-11-23 DIAGNOSIS — I69920 Aphasia following unspecified cerebrovascular disease: Secondary | ICD-10-CM | POA: Diagnosis not present

## 2019-11-23 DIAGNOSIS — N182 Chronic kidney disease, stage 2 (mild): Secondary | ICD-10-CM | POA: Diagnosis not present

## 2019-11-23 DIAGNOSIS — E7800 Pure hypercholesterolemia, unspecified: Secondary | ICD-10-CM | POA: Diagnosis not present

## 2019-11-23 DIAGNOSIS — M1990 Unspecified osteoarthritis, unspecified site: Secondary | ICD-10-CM | POA: Diagnosis not present

## 2019-11-23 DIAGNOSIS — E1140 Type 2 diabetes mellitus with diabetic neuropathy, unspecified: Secondary | ICD-10-CM | POA: Diagnosis not present

## 2019-11-23 DIAGNOSIS — N390 Urinary tract infection, site not specified: Secondary | ICD-10-CM | POA: Diagnosis not present

## 2019-11-23 DIAGNOSIS — Z7902 Long term (current) use of antithrombotics/antiplatelets: Secondary | ICD-10-CM | POA: Diagnosis not present

## 2019-11-23 DIAGNOSIS — Z8701 Personal history of pneumonia (recurrent): Secondary | ICD-10-CM | POA: Diagnosis not present

## 2019-11-23 DIAGNOSIS — E1122 Type 2 diabetes mellitus with diabetic chronic kidney disease: Secondary | ICD-10-CM | POA: Diagnosis not present

## 2019-11-24 DIAGNOSIS — E1140 Type 2 diabetes mellitus with diabetic neuropathy, unspecified: Secondary | ICD-10-CM | POA: Diagnosis not present

## 2019-11-24 DIAGNOSIS — I69991 Dysphagia following unspecified cerebrovascular disease: Secondary | ICD-10-CM | POA: Diagnosis not present

## 2019-11-24 DIAGNOSIS — N390 Urinary tract infection, site not specified: Secondary | ICD-10-CM | POA: Diagnosis not present

## 2019-11-24 DIAGNOSIS — I129 Hypertensive chronic kidney disease with stage 1 through stage 4 chronic kidney disease, or unspecified chronic kidney disease: Secondary | ICD-10-CM | POA: Diagnosis not present

## 2019-11-24 DIAGNOSIS — N182 Chronic kidney disease, stage 2 (mild): Secondary | ICD-10-CM | POA: Diagnosis not present

## 2019-11-24 DIAGNOSIS — I69920 Aphasia following unspecified cerebrovascular disease: Secondary | ICD-10-CM | POA: Diagnosis not present

## 2019-11-29 DIAGNOSIS — I69920 Aphasia following unspecified cerebrovascular disease: Secondary | ICD-10-CM | POA: Diagnosis not present

## 2019-11-29 DIAGNOSIS — N182 Chronic kidney disease, stage 2 (mild): Secondary | ICD-10-CM | POA: Diagnosis not present

## 2019-11-29 DIAGNOSIS — N390 Urinary tract infection, site not specified: Secondary | ICD-10-CM | POA: Diagnosis not present

## 2019-11-29 DIAGNOSIS — E1140 Type 2 diabetes mellitus with diabetic neuropathy, unspecified: Secondary | ICD-10-CM | POA: Diagnosis not present

## 2019-11-29 DIAGNOSIS — I69991 Dysphagia following unspecified cerebrovascular disease: Secondary | ICD-10-CM | POA: Diagnosis not present

## 2019-11-29 DIAGNOSIS — I129 Hypertensive chronic kidney disease with stage 1 through stage 4 chronic kidney disease, or unspecified chronic kidney disease: Secondary | ICD-10-CM | POA: Diagnosis not present

## 2019-12-08 DIAGNOSIS — N182 Chronic kidney disease, stage 2 (mild): Secondary | ICD-10-CM | POA: Diagnosis not present

## 2019-12-08 DIAGNOSIS — N390 Urinary tract infection, site not specified: Secondary | ICD-10-CM | POA: Diagnosis not present

## 2019-12-08 DIAGNOSIS — E1140 Type 2 diabetes mellitus with diabetic neuropathy, unspecified: Secondary | ICD-10-CM | POA: Diagnosis not present

## 2019-12-08 DIAGNOSIS — I69920 Aphasia following unspecified cerebrovascular disease: Secondary | ICD-10-CM | POA: Diagnosis not present

## 2019-12-08 DIAGNOSIS — I69991 Dysphagia following unspecified cerebrovascular disease: Secondary | ICD-10-CM | POA: Diagnosis not present

## 2019-12-08 DIAGNOSIS — I129 Hypertensive chronic kidney disease with stage 1 through stage 4 chronic kidney disease, or unspecified chronic kidney disease: Secondary | ICD-10-CM | POA: Diagnosis not present

## 2019-12-15 DIAGNOSIS — E1140 Type 2 diabetes mellitus with diabetic neuropathy, unspecified: Secondary | ICD-10-CM | POA: Diagnosis not present

## 2019-12-15 DIAGNOSIS — I69991 Dysphagia following unspecified cerebrovascular disease: Secondary | ICD-10-CM | POA: Diagnosis not present

## 2019-12-15 DIAGNOSIS — I129 Hypertensive chronic kidney disease with stage 1 through stage 4 chronic kidney disease, or unspecified chronic kidney disease: Secondary | ICD-10-CM | POA: Diagnosis not present

## 2019-12-15 DIAGNOSIS — I69920 Aphasia following unspecified cerebrovascular disease: Secondary | ICD-10-CM | POA: Diagnosis not present

## 2019-12-15 DIAGNOSIS — N182 Chronic kidney disease, stage 2 (mild): Secondary | ICD-10-CM | POA: Diagnosis not present

## 2019-12-15 DIAGNOSIS — N390 Urinary tract infection, site not specified: Secondary | ICD-10-CM | POA: Diagnosis not present

## 2019-12-22 DIAGNOSIS — I69920 Aphasia following unspecified cerebrovascular disease: Secondary | ICD-10-CM | POA: Diagnosis not present

## 2019-12-22 DIAGNOSIS — I69991 Dysphagia following unspecified cerebrovascular disease: Secondary | ICD-10-CM | POA: Diagnosis not present

## 2019-12-22 DIAGNOSIS — E1140 Type 2 diabetes mellitus with diabetic neuropathy, unspecified: Secondary | ICD-10-CM | POA: Diagnosis not present

## 2019-12-22 DIAGNOSIS — N390 Urinary tract infection, site not specified: Secondary | ICD-10-CM | POA: Diagnosis not present

## 2019-12-22 DIAGNOSIS — N182 Chronic kidney disease, stage 2 (mild): Secondary | ICD-10-CM | POA: Diagnosis not present

## 2019-12-22 DIAGNOSIS — I129 Hypertensive chronic kidney disease with stage 1 through stage 4 chronic kidney disease, or unspecified chronic kidney disease: Secondary | ICD-10-CM | POA: Diagnosis not present

## 2019-12-30 DIAGNOSIS — E785 Hyperlipidemia, unspecified: Secondary | ICD-10-CM | POA: Diagnosis not present

## 2019-12-30 DIAGNOSIS — Z6827 Body mass index (BMI) 27.0-27.9, adult: Secondary | ICD-10-CM | POA: Diagnosis not present

## 2019-12-30 DIAGNOSIS — Z79899 Other long term (current) drug therapy: Secondary | ICD-10-CM | POA: Diagnosis not present

## 2019-12-30 DIAGNOSIS — I10 Essential (primary) hypertension: Secondary | ICD-10-CM | POA: Diagnosis not present

## 2019-12-30 DIAGNOSIS — G629 Polyneuropathy, unspecified: Secondary | ICD-10-CM | POA: Diagnosis not present

## 2020-01-15 DIAGNOSIS — R062 Wheezing: Secondary | ICD-10-CM | POA: Diagnosis not present

## 2020-01-15 DIAGNOSIS — I69391 Dysphagia following cerebral infarction: Secondary | ICD-10-CM | POA: Diagnosis not present

## 2020-01-15 DIAGNOSIS — Z66 Do not resuscitate: Secondary | ICD-10-CM | POA: Diagnosis present

## 2020-01-15 DIAGNOSIS — J690 Pneumonitis due to inhalation of food and vomit: Secondary | ICD-10-CM | POA: Diagnosis present

## 2020-01-15 DIAGNOSIS — J189 Pneumonia, unspecified organism: Secondary | ICD-10-CM | POA: Diagnosis not present

## 2020-01-15 DIAGNOSIS — R600 Localized edema: Secondary | ICD-10-CM | POA: Diagnosis not present

## 2020-01-15 DIAGNOSIS — R0902 Hypoxemia: Secondary | ICD-10-CM | POA: Diagnosis not present

## 2020-01-15 DIAGNOSIS — G9341 Metabolic encephalopathy: Secondary | ICD-10-CM | POA: Diagnosis present

## 2020-01-15 DIAGNOSIS — I10 Essential (primary) hypertension: Secondary | ICD-10-CM | POA: Diagnosis not present

## 2020-01-15 DIAGNOSIS — I69328 Other speech and language deficits following cerebral infarction: Secondary | ICD-10-CM | POA: Diagnosis not present

## 2020-01-15 DIAGNOSIS — E7800 Pure hypercholesterolemia, unspecified: Secondary | ICD-10-CM | POA: Diagnosis present

## 2020-01-15 DIAGNOSIS — R509 Fever, unspecified: Secondary | ICD-10-CM | POA: Diagnosis not present

## 2020-01-15 DIAGNOSIS — Z9181 History of falling: Secondary | ICD-10-CM | POA: Diagnosis not present

## 2020-01-15 DIAGNOSIS — E119 Type 2 diabetes mellitus without complications: Secondary | ICD-10-CM | POA: Diagnosis not present

## 2020-01-15 DIAGNOSIS — I69322 Dysarthria following cerebral infarction: Secondary | ICD-10-CM | POA: Diagnosis not present

## 2020-01-15 DIAGNOSIS — R531 Weakness: Secondary | ICD-10-CM | POA: Diagnosis not present

## 2020-01-15 DIAGNOSIS — Z993 Dependence on wheelchair: Secondary | ICD-10-CM | POA: Diagnosis not present

## 2020-01-15 DIAGNOSIS — D696 Thrombocytopenia, unspecified: Secondary | ICD-10-CM | POA: Diagnosis present

## 2020-01-15 DIAGNOSIS — Z79899 Other long term (current) drug therapy: Secondary | ICD-10-CM | POA: Diagnosis not present

## 2020-01-15 DIAGNOSIS — R1313 Dysphagia, pharyngeal phase: Secondary | ICD-10-CM | POA: Diagnosis present

## 2020-01-15 DIAGNOSIS — J9601 Acute respiratory failure with hypoxia: Secondary | ICD-10-CM | POA: Diagnosis present

## 2020-01-15 DIAGNOSIS — R5381 Other malaise: Secondary | ICD-10-CM | POA: Diagnosis present

## 2020-01-15 DIAGNOSIS — R001 Bradycardia, unspecified: Secondary | ICD-10-CM | POA: Diagnosis present

## 2020-01-15 DIAGNOSIS — N179 Acute kidney failure, unspecified: Secondary | ICD-10-CM | POA: Diagnosis present

## 2020-01-15 DIAGNOSIS — M1990 Unspecified osteoarthritis, unspecified site: Secondary | ICD-10-CM | POA: Diagnosis present

## 2020-01-15 DIAGNOSIS — Z7902 Long term (current) use of antithrombotics/antiplatelets: Secondary | ICD-10-CM | POA: Diagnosis not present

## 2020-01-15 DIAGNOSIS — I361 Nonrheumatic tricuspid (valve) insufficiency: Secondary | ICD-10-CM | POA: Diagnosis not present

## 2020-01-15 DIAGNOSIS — R4182 Altered mental status, unspecified: Secondary | ICD-10-CM | POA: Diagnosis not present

## 2020-01-21 DIAGNOSIS — R262 Difficulty in walking, not elsewhere classified: Secondary | ICD-10-CM | POA: Diagnosis not present

## 2020-01-21 DIAGNOSIS — R1310 Dysphagia, unspecified: Secondary | ICD-10-CM | POA: Diagnosis not present

## 2020-01-21 DIAGNOSIS — I639 Cerebral infarction, unspecified: Secondary | ICD-10-CM | POA: Diagnosis not present

## 2020-01-21 DIAGNOSIS — J690 Pneumonitis due to inhalation of food and vomit: Secondary | ICD-10-CM | POA: Diagnosis not present

## 2020-01-26 ENCOUNTER — Other Ambulatory Visit: Payer: Self-pay | Admitting: *Deleted

## 2020-01-26 NOTE — Patient Outreach (Signed)
Member screened for potential [facility] Care Management needs as a benefit of [place] [employer].  Mr. Sitar is receiving skilled therapy at [facility]. Update received from [place]. Member is from home with wife prior. Transition plan likely ALF.  Will continue to follow for transition plans while member resides in SNF.   Marthenia Rolling, MSN-Ed, RN,BSN [place] Acute Care Coordinator [phone] [facility]) [phone]  (Toll free office)

## 2020-01-27 DIAGNOSIS — R52 Pain, unspecified: Secondary | ICD-10-CM | POA: Diagnosis not present

## 2020-02-07 ENCOUNTER — Other Ambulatory Visit: Payer: Self-pay | Admitting: *Deleted

## 2020-02-07 DIAGNOSIS — L988 Other specified disorders of the skin and subcutaneous tissue: Secondary | ICD-10-CM | POA: Diagnosis not present

## 2020-02-07 NOTE — Patient Outreach (Signed)
Screened for potential [facility] Care Management needs as a benefit of  [employer].  Mr. Arrighi is currently receiving skilled therapy at [facility].  Writer attended telephonic interdisciplinary team meeting to assess for disposition needs and transition plan for resident.   Facility reports member's transition plan is for ALF.   Marthenia Rolling, MSN-Ed, RN,BSN [place] Acute Care Coordinator [phone] [facility]) [phone]  (Toll free office)

## 2020-02-14 DIAGNOSIS — L988 Other specified disorders of the skin and subcutaneous tissue: Secondary | ICD-10-CM | POA: Diagnosis not present

## 2020-02-15 ENCOUNTER — Other Ambulatory Visit: Payer: Self-pay | Admitting: *Deleted

## 2020-02-15 DIAGNOSIS — E1122 Type 2 diabetes mellitus with diabetic chronic kidney disease: Secondary | ICD-10-CM | POA: Diagnosis not present

## 2020-02-15 DIAGNOSIS — R1310 Dysphagia, unspecified: Secondary | ICD-10-CM | POA: Diagnosis not present

## 2020-02-15 DIAGNOSIS — J690 Pneumonitis due to inhalation of food and vomit: Secondary | ICD-10-CM | POA: Diagnosis not present

## 2020-02-15 DIAGNOSIS — E1140 Type 2 diabetes mellitus with diabetic neuropathy, unspecified: Secondary | ICD-10-CM | POA: Diagnosis not present

## 2020-02-15 DIAGNOSIS — M2550 Pain in unspecified joint: Secondary | ICD-10-CM | POA: Diagnosis not present

## 2020-02-15 DIAGNOSIS — M6281 Muscle weakness (generalized): Secondary | ICD-10-CM | POA: Diagnosis not present

## 2020-02-15 DIAGNOSIS — L03115 Cellulitis of right lower limb: Secondary | ICD-10-CM | POA: Diagnosis not present

## 2020-02-15 DIAGNOSIS — I10 Essential (primary) hypertension: Secondary | ICD-10-CM | POA: Diagnosis not present

## 2020-02-15 DIAGNOSIS — R269 Unspecified abnormalities of gait and mobility: Secondary | ICD-10-CM | POA: Diagnosis not present

## 2020-02-15 DIAGNOSIS — I129 Hypertensive chronic kidney disease with stage 1 through stage 4 chronic kidney disease, or unspecified chronic kidney disease: Secondary | ICD-10-CM | POA: Diagnosis not present

## 2020-02-15 DIAGNOSIS — R4189 Other symptoms and signs involving cognitive functions and awareness: Secondary | ICD-10-CM | POA: Diagnosis not present

## 2020-02-15 DIAGNOSIS — J9610 Chronic respiratory failure, unspecified whether with hypoxia or hypercapnia: Secondary | ICD-10-CM | POA: Diagnosis not present

## 2020-02-15 DIAGNOSIS — I69322 Dysarthria following cerebral infarction: Secondary | ICD-10-CM | POA: Diagnosis not present

## 2020-02-15 DIAGNOSIS — M1990 Unspecified osteoarthritis, unspecified site: Secondary | ICD-10-CM | POA: Diagnosis not present

## 2020-02-15 DIAGNOSIS — M810 Age-related osteoporosis without current pathological fracture: Secondary | ICD-10-CM | POA: Diagnosis not present

## 2020-02-15 DIAGNOSIS — G9341 Metabolic encephalopathy: Secondary | ICD-10-CM | POA: Diagnosis not present

## 2020-02-15 DIAGNOSIS — E119 Type 2 diabetes mellitus without complications: Secondary | ICD-10-CM | POA: Diagnosis not present

## 2020-02-15 DIAGNOSIS — E785 Hyperlipidemia, unspecified: Secondary | ICD-10-CM | POA: Diagnosis not present

## 2020-02-15 DIAGNOSIS — N183 Chronic kidney disease, stage 3 unspecified: Secondary | ICD-10-CM | POA: Diagnosis not present

## 2020-02-15 NOTE — Patient Outreach (Signed)
Screened for potential [facility] Care Management needs as a benefit of  [employer].  Writer attended [facility] telephonic interdisciplinary team meeting.  Facility reports member transitioned to ALF on yesterday 02/14/20 per family request.   No identifiable [facility] Care Management needs at this time.   Marthenia Rolling, MSN-Ed, RN,BSN [place] Acute Care Coordinator [phone] [facility]) [phone]  (Toll free office)

## 2020-02-17 DIAGNOSIS — L03115 Cellulitis of right lower limb: Secondary | ICD-10-CM | POA: Diagnosis not present

## 2020-02-17 DIAGNOSIS — E1122 Type 2 diabetes mellitus with diabetic chronic kidney disease: Secondary | ICD-10-CM | POA: Diagnosis not present

## 2020-02-17 DIAGNOSIS — N183 Chronic kidney disease, stage 3 unspecified: Secondary | ICD-10-CM | POA: Diagnosis not present

## 2020-02-17 DIAGNOSIS — R1310 Dysphagia, unspecified: Secondary | ICD-10-CM | POA: Diagnosis not present

## 2020-02-17 DIAGNOSIS — J690 Pneumonitis due to inhalation of food and vomit: Secondary | ICD-10-CM | POA: Diagnosis not present

## 2020-02-17 DIAGNOSIS — I129 Hypertensive chronic kidney disease with stage 1 through stage 4 chronic kidney disease, or unspecified chronic kidney disease: Secondary | ICD-10-CM | POA: Diagnosis not present

## 2020-02-19 DIAGNOSIS — I129 Hypertensive chronic kidney disease with stage 1 through stage 4 chronic kidney disease, or unspecified chronic kidney disease: Secondary | ICD-10-CM | POA: Diagnosis not present

## 2020-02-19 DIAGNOSIS — J690 Pneumonitis due to inhalation of food and vomit: Secondary | ICD-10-CM | POA: Diagnosis not present

## 2020-02-19 DIAGNOSIS — E1122 Type 2 diabetes mellitus with diabetic chronic kidney disease: Secondary | ICD-10-CM | POA: Diagnosis not present

## 2020-02-19 DIAGNOSIS — N183 Chronic kidney disease, stage 3 unspecified: Secondary | ICD-10-CM | POA: Diagnosis not present

## 2020-02-19 DIAGNOSIS — R1310 Dysphagia, unspecified: Secondary | ICD-10-CM | POA: Diagnosis not present

## 2020-02-19 DIAGNOSIS — L03115 Cellulitis of right lower limb: Secondary | ICD-10-CM | POA: Diagnosis not present

## 2020-02-21 DIAGNOSIS — L03115 Cellulitis of right lower limb: Secondary | ICD-10-CM | POA: Diagnosis not present

## 2020-02-21 DIAGNOSIS — R1310 Dysphagia, unspecified: Secondary | ICD-10-CM | POA: Diagnosis not present

## 2020-02-21 DIAGNOSIS — J690 Pneumonitis due to inhalation of food and vomit: Secondary | ICD-10-CM | POA: Diagnosis not present

## 2020-02-21 DIAGNOSIS — E1122 Type 2 diabetes mellitus with diabetic chronic kidney disease: Secondary | ICD-10-CM | POA: Diagnosis not present

## 2020-02-21 DIAGNOSIS — N183 Chronic kidney disease, stage 3 unspecified: Secondary | ICD-10-CM | POA: Diagnosis not present

## 2020-02-21 DIAGNOSIS — I129 Hypertensive chronic kidney disease with stage 1 through stage 4 chronic kidney disease, or unspecified chronic kidney disease: Secondary | ICD-10-CM | POA: Diagnosis not present

## 2020-02-23 DIAGNOSIS — I129 Hypertensive chronic kidney disease with stage 1 through stage 4 chronic kidney disease, or unspecified chronic kidney disease: Secondary | ICD-10-CM | POA: Diagnosis not present

## 2020-02-23 DIAGNOSIS — L03115 Cellulitis of right lower limb: Secondary | ICD-10-CM | POA: Diagnosis not present

## 2020-02-23 DIAGNOSIS — R1310 Dysphagia, unspecified: Secondary | ICD-10-CM | POA: Diagnosis not present

## 2020-02-23 DIAGNOSIS — J690 Pneumonitis due to inhalation of food and vomit: Secondary | ICD-10-CM | POA: Diagnosis not present

## 2020-02-23 DIAGNOSIS — N183 Chronic kidney disease, stage 3 unspecified: Secondary | ICD-10-CM | POA: Diagnosis not present

## 2020-02-23 DIAGNOSIS — E1122 Type 2 diabetes mellitus with diabetic chronic kidney disease: Secondary | ICD-10-CM | POA: Diagnosis not present

## 2020-02-27 DIAGNOSIS — J690 Pneumonitis due to inhalation of food and vomit: Secondary | ICD-10-CM | POA: Diagnosis not present

## 2020-02-27 DIAGNOSIS — L03115 Cellulitis of right lower limb: Secondary | ICD-10-CM | POA: Diagnosis not present

## 2020-02-27 DIAGNOSIS — I129 Hypertensive chronic kidney disease with stage 1 through stage 4 chronic kidney disease, or unspecified chronic kidney disease: Secondary | ICD-10-CM | POA: Diagnosis not present

## 2020-02-27 DIAGNOSIS — E1122 Type 2 diabetes mellitus with diabetic chronic kidney disease: Secondary | ICD-10-CM | POA: Diagnosis not present

## 2020-02-27 DIAGNOSIS — N183 Chronic kidney disease, stage 3 unspecified: Secondary | ICD-10-CM | POA: Diagnosis not present

## 2020-02-27 DIAGNOSIS — R1310 Dysphagia, unspecified: Secondary | ICD-10-CM | POA: Diagnosis not present

## 2020-03-01 DIAGNOSIS — I129 Hypertensive chronic kidney disease with stage 1 through stage 4 chronic kidney disease, or unspecified chronic kidney disease: Secondary | ICD-10-CM | POA: Diagnosis not present

## 2020-03-01 DIAGNOSIS — L03115 Cellulitis of right lower limb: Secondary | ICD-10-CM | POA: Diagnosis not present

## 2020-03-01 DIAGNOSIS — R1310 Dysphagia, unspecified: Secondary | ICD-10-CM | POA: Diagnosis not present

## 2020-03-01 DIAGNOSIS — J690 Pneumonitis due to inhalation of food and vomit: Secondary | ICD-10-CM | POA: Diagnosis not present

## 2020-03-01 DIAGNOSIS — E1122 Type 2 diabetes mellitus with diabetic chronic kidney disease: Secondary | ICD-10-CM | POA: Diagnosis not present

## 2020-03-01 DIAGNOSIS — N183 Chronic kidney disease, stage 3 unspecified: Secondary | ICD-10-CM | POA: Diagnosis not present

## 2020-03-02 DIAGNOSIS — R2689 Other abnormalities of gait and mobility: Secondary | ICD-10-CM | POA: Diagnosis not present

## 2020-03-02 DIAGNOSIS — S0083XA Contusion of other part of head, initial encounter: Secondary | ICD-10-CM | POA: Diagnosis not present

## 2020-03-02 DIAGNOSIS — R269 Unspecified abnormalities of gait and mobility: Secondary | ICD-10-CM | POA: Diagnosis not present

## 2020-03-02 DIAGNOSIS — M6281 Muscle weakness (generalized): Secondary | ICD-10-CM | POA: Diagnosis not present

## 2020-03-02 DIAGNOSIS — F0390 Unspecified dementia without behavioral disturbance: Secondary | ICD-10-CM | POA: Diagnosis not present

## 2020-03-02 DIAGNOSIS — W19XXXA Unspecified fall, initial encounter: Secondary | ICD-10-CM | POA: Diagnosis not present

## 2020-03-05 DIAGNOSIS — J690 Pneumonitis due to inhalation of food and vomit: Secondary | ICD-10-CM | POA: Diagnosis not present

## 2020-03-05 DIAGNOSIS — E1122 Type 2 diabetes mellitus with diabetic chronic kidney disease: Secondary | ICD-10-CM | POA: Diagnosis not present

## 2020-03-05 DIAGNOSIS — L03115 Cellulitis of right lower limb: Secondary | ICD-10-CM | POA: Diagnosis not present

## 2020-03-05 DIAGNOSIS — I129 Hypertensive chronic kidney disease with stage 1 through stage 4 chronic kidney disease, or unspecified chronic kidney disease: Secondary | ICD-10-CM | POA: Diagnosis not present

## 2020-03-05 DIAGNOSIS — R1310 Dysphagia, unspecified: Secondary | ICD-10-CM | POA: Diagnosis not present

## 2020-03-05 DIAGNOSIS — N183 Chronic kidney disease, stage 3 unspecified: Secondary | ICD-10-CM | POA: Diagnosis not present

## 2020-03-07 DIAGNOSIS — F0390 Unspecified dementia without behavioral disturbance: Secondary | ICD-10-CM | POA: Diagnosis not present

## 2020-03-07 DIAGNOSIS — R600 Localized edema: Secondary | ICD-10-CM | POA: Diagnosis not present

## 2020-03-07 DIAGNOSIS — M6281 Muscle weakness (generalized): Secondary | ICD-10-CM | POA: Diagnosis not present

## 2020-03-07 DIAGNOSIS — R269 Unspecified abnormalities of gait and mobility: Secondary | ICD-10-CM | POA: Diagnosis not present

## 2020-03-08 DIAGNOSIS — L03115 Cellulitis of right lower limb: Secondary | ICD-10-CM | POA: Diagnosis not present

## 2020-03-08 DIAGNOSIS — J690 Pneumonitis due to inhalation of food and vomit: Secondary | ICD-10-CM | POA: Diagnosis not present

## 2020-03-08 DIAGNOSIS — E1122 Type 2 diabetes mellitus with diabetic chronic kidney disease: Secondary | ICD-10-CM | POA: Diagnosis not present

## 2020-03-08 DIAGNOSIS — R1310 Dysphagia, unspecified: Secondary | ICD-10-CM | POA: Diagnosis not present

## 2020-03-08 DIAGNOSIS — N183 Chronic kidney disease, stage 3 unspecified: Secondary | ICD-10-CM | POA: Diagnosis not present

## 2020-03-08 DIAGNOSIS — I129 Hypertensive chronic kidney disease with stage 1 through stage 4 chronic kidney disease, or unspecified chronic kidney disease: Secondary | ICD-10-CM | POA: Diagnosis not present

## 2020-03-12 DIAGNOSIS — R1310 Dysphagia, unspecified: Secondary | ICD-10-CM | POA: Diagnosis not present

## 2020-03-12 DIAGNOSIS — L03115 Cellulitis of right lower limb: Secondary | ICD-10-CM | POA: Diagnosis not present

## 2020-03-12 DIAGNOSIS — E1122 Type 2 diabetes mellitus with diabetic chronic kidney disease: Secondary | ICD-10-CM | POA: Diagnosis not present

## 2020-03-12 DIAGNOSIS — Z8673 Personal history of transient ischemic attack (TIA), and cerebral infarction without residual deficits: Secondary | ICD-10-CM | POA: Diagnosis not present

## 2020-03-12 DIAGNOSIS — I129 Hypertensive chronic kidney disease with stage 1 through stage 4 chronic kidney disease, or unspecified chronic kidney disease: Secondary | ICD-10-CM | POA: Diagnosis not present

## 2020-03-12 DIAGNOSIS — R278 Other lack of coordination: Secondary | ICD-10-CM | POA: Diagnosis not present

## 2020-03-12 DIAGNOSIS — J690 Pneumonitis due to inhalation of food and vomit: Secondary | ICD-10-CM | POA: Diagnosis not present

## 2020-03-12 DIAGNOSIS — F0390 Unspecified dementia without behavioral disturbance: Secondary | ICD-10-CM | POA: Diagnosis not present

## 2020-03-12 DIAGNOSIS — N183 Chronic kidney disease, stage 3 unspecified: Secondary | ICD-10-CM | POA: Diagnosis not present

## 2020-03-15 DIAGNOSIS — J690 Pneumonitis due to inhalation of food and vomit: Secondary | ICD-10-CM | POA: Diagnosis not present

## 2020-03-15 DIAGNOSIS — R1310 Dysphagia, unspecified: Secondary | ICD-10-CM | POA: Diagnosis not present

## 2020-03-15 DIAGNOSIS — N183 Chronic kidney disease, stage 3 unspecified: Secondary | ICD-10-CM | POA: Diagnosis not present

## 2020-03-15 DIAGNOSIS — Z20828 Contact with and (suspected) exposure to other viral communicable diseases: Secondary | ICD-10-CM | POA: Diagnosis not present

## 2020-03-15 DIAGNOSIS — I129 Hypertensive chronic kidney disease with stage 1 through stage 4 chronic kidney disease, or unspecified chronic kidney disease: Secondary | ICD-10-CM | POA: Diagnosis not present

## 2020-03-15 DIAGNOSIS — E1122 Type 2 diabetes mellitus with diabetic chronic kidney disease: Secondary | ICD-10-CM | POA: Diagnosis not present

## 2020-03-15 DIAGNOSIS — L03115 Cellulitis of right lower limb: Secondary | ICD-10-CM | POA: Diagnosis not present

## 2020-03-16 DIAGNOSIS — I129 Hypertensive chronic kidney disease with stage 1 through stage 4 chronic kidney disease, or unspecified chronic kidney disease: Secondary | ICD-10-CM | POA: Diagnosis not present

## 2020-03-16 DIAGNOSIS — M810 Age-related osteoporosis without current pathological fracture: Secondary | ICD-10-CM | POA: Diagnosis not present

## 2020-03-16 DIAGNOSIS — M1990 Unspecified osteoarthritis, unspecified site: Secondary | ICD-10-CM | POA: Diagnosis not present

## 2020-03-16 DIAGNOSIS — J690 Pneumonitis due to inhalation of food and vomit: Secondary | ICD-10-CM | POA: Diagnosis not present

## 2020-03-16 DIAGNOSIS — L03115 Cellulitis of right lower limb: Secondary | ICD-10-CM | POA: Diagnosis not present

## 2020-03-16 DIAGNOSIS — E1122 Type 2 diabetes mellitus with diabetic chronic kidney disease: Secondary | ICD-10-CM | POA: Diagnosis not present

## 2020-03-16 DIAGNOSIS — E1140 Type 2 diabetes mellitus with diabetic neuropathy, unspecified: Secondary | ICD-10-CM | POA: Diagnosis not present

## 2020-03-16 DIAGNOSIS — M2550 Pain in unspecified joint: Secondary | ICD-10-CM | POA: Diagnosis not present

## 2020-03-16 DIAGNOSIS — N183 Chronic kidney disease, stage 3 unspecified: Secondary | ICD-10-CM | POA: Diagnosis not present

## 2020-03-16 DIAGNOSIS — R1310 Dysphagia, unspecified: Secondary | ICD-10-CM | POA: Diagnosis not present

## 2020-03-16 DIAGNOSIS — E785 Hyperlipidemia, unspecified: Secondary | ICD-10-CM | POA: Diagnosis not present

## 2020-03-16 DIAGNOSIS — J9610 Chronic respiratory failure, unspecified whether with hypoxia or hypercapnia: Secondary | ICD-10-CM | POA: Diagnosis not present

## 2020-03-16 DIAGNOSIS — I69322 Dysarthria following cerebral infarction: Secondary | ICD-10-CM | POA: Diagnosis not present

## 2020-03-19 DIAGNOSIS — L03115 Cellulitis of right lower limb: Secondary | ICD-10-CM | POA: Diagnosis not present

## 2020-03-19 DIAGNOSIS — N183 Chronic kidney disease, stage 3 unspecified: Secondary | ICD-10-CM | POA: Diagnosis not present

## 2020-03-19 DIAGNOSIS — E1122 Type 2 diabetes mellitus with diabetic chronic kidney disease: Secondary | ICD-10-CM | POA: Diagnosis not present

## 2020-03-19 DIAGNOSIS — M2550 Pain in unspecified joint: Secondary | ICD-10-CM | POA: Diagnosis not present

## 2020-03-19 DIAGNOSIS — I129 Hypertensive chronic kidney disease with stage 1 through stage 4 chronic kidney disease, or unspecified chronic kidney disease: Secondary | ICD-10-CM | POA: Diagnosis not present

## 2020-03-19 DIAGNOSIS — J690 Pneumonitis due to inhalation of food and vomit: Secondary | ICD-10-CM | POA: Diagnosis not present

## 2020-03-21 DIAGNOSIS — R404 Transient alteration of awareness: Secondary | ICD-10-CM | POA: Diagnosis not present

## 2020-03-21 DIAGNOSIS — Z7401 Bed confinement status: Secondary | ICD-10-CM | POA: Diagnosis not present

## 2020-03-21 DIAGNOSIS — J90 Pleural effusion, not elsewhere classified: Secondary | ICD-10-CM | POA: Diagnosis not present

## 2020-03-21 DIAGNOSIS — J189 Pneumonia, unspecified organism: Secondary | ICD-10-CM | POA: Diagnosis not present

## 2020-03-21 DIAGNOSIS — J9811 Atelectasis: Secondary | ICD-10-CM | POA: Diagnosis not present

## 2020-03-21 DIAGNOSIS — R0902 Hypoxemia: Secondary | ICD-10-CM | POA: Diagnosis not present

## 2020-03-21 DIAGNOSIS — E785 Hyperlipidemia, unspecified: Secondary | ICD-10-CM | POA: Diagnosis not present

## 2020-03-21 DIAGNOSIS — E119 Type 2 diabetes mellitus without complications: Secondary | ICD-10-CM | POA: Diagnosis not present

## 2020-03-21 DIAGNOSIS — I10 Essential (primary) hypertension: Secondary | ICD-10-CM | POA: Diagnosis not present

## 2020-03-21 DIAGNOSIS — Z79899 Other long term (current) drug therapy: Secondary | ICD-10-CM | POA: Diagnosis not present

## 2020-03-21 DIAGNOSIS — M2550 Pain in unspecified joint: Secondary | ICD-10-CM | POA: Diagnosis not present

## 2020-03-21 DIAGNOSIS — R918 Other nonspecific abnormal finding of lung field: Secondary | ICD-10-CM | POA: Diagnosis not present

## 2020-03-21 DIAGNOSIS — R410 Disorientation, unspecified: Secondary | ICD-10-CM | POA: Diagnosis not present

## 2020-03-21 DIAGNOSIS — R4182 Altered mental status, unspecified: Secondary | ICD-10-CM | POA: Diagnosis not present

## 2020-03-21 DIAGNOSIS — R0602 Shortness of breath: Secondary | ICD-10-CM | POA: Diagnosis not present

## 2020-03-22 DIAGNOSIS — I129 Hypertensive chronic kidney disease with stage 1 through stage 4 chronic kidney disease, or unspecified chronic kidney disease: Secondary | ICD-10-CM | POA: Diagnosis not present

## 2020-03-22 DIAGNOSIS — J690 Pneumonitis due to inhalation of food and vomit: Secondary | ICD-10-CM | POA: Diagnosis not present

## 2020-03-22 DIAGNOSIS — Z20828 Contact with and (suspected) exposure to other viral communicable diseases: Secondary | ICD-10-CM | POA: Diagnosis not present

## 2020-03-22 DIAGNOSIS — M2550 Pain in unspecified joint: Secondary | ICD-10-CM | POA: Diagnosis not present

## 2020-03-22 DIAGNOSIS — L03115 Cellulitis of right lower limb: Secondary | ICD-10-CM | POA: Diagnosis not present

## 2020-03-22 DIAGNOSIS — N183 Chronic kidney disease, stage 3 unspecified: Secondary | ICD-10-CM | POA: Diagnosis not present

## 2020-03-22 DIAGNOSIS — E1122 Type 2 diabetes mellitus with diabetic chronic kidney disease: Secondary | ICD-10-CM | POA: Diagnosis not present

## 2020-03-26 DIAGNOSIS — N183 Chronic kidney disease, stage 3 unspecified: Secondary | ICD-10-CM | POA: Diagnosis not present

## 2020-03-26 DIAGNOSIS — E1122 Type 2 diabetes mellitus with diabetic chronic kidney disease: Secondary | ICD-10-CM | POA: Diagnosis not present

## 2020-03-26 DIAGNOSIS — J690 Pneumonitis due to inhalation of food and vomit: Secondary | ICD-10-CM | POA: Diagnosis not present

## 2020-03-26 DIAGNOSIS — M2550 Pain in unspecified joint: Secondary | ICD-10-CM | POA: Diagnosis not present

## 2020-03-26 DIAGNOSIS — L03115 Cellulitis of right lower limb: Secondary | ICD-10-CM | POA: Diagnosis not present

## 2020-03-26 DIAGNOSIS — I129 Hypertensive chronic kidney disease with stage 1 through stage 4 chronic kidney disease, or unspecified chronic kidney disease: Secondary | ICD-10-CM | POA: Diagnosis not present

## 2020-03-29 DIAGNOSIS — Z20828 Contact with and (suspected) exposure to other viral communicable diseases: Secondary | ICD-10-CM | POA: Diagnosis not present

## 2020-03-29 DIAGNOSIS — M2550 Pain in unspecified joint: Secondary | ICD-10-CM | POA: Diagnosis not present

## 2020-03-29 DIAGNOSIS — N183 Chronic kidney disease, stage 3 unspecified: Secondary | ICD-10-CM | POA: Diagnosis not present

## 2020-03-29 DIAGNOSIS — E1122 Type 2 diabetes mellitus with diabetic chronic kidney disease: Secondary | ICD-10-CM | POA: Diagnosis not present

## 2020-03-29 DIAGNOSIS — I129 Hypertensive chronic kidney disease with stage 1 through stage 4 chronic kidney disease, or unspecified chronic kidney disease: Secondary | ICD-10-CM | POA: Diagnosis not present

## 2020-03-29 DIAGNOSIS — L03115 Cellulitis of right lower limb: Secondary | ICD-10-CM | POA: Diagnosis not present

## 2020-03-29 DIAGNOSIS — J690 Pneumonitis due to inhalation of food and vomit: Secondary | ICD-10-CM | POA: Diagnosis not present

## 2020-03-30 DIAGNOSIS — R1310 Dysphagia, unspecified: Secondary | ICD-10-CM | POA: Diagnosis not present

## 2020-03-30 DIAGNOSIS — F0390 Unspecified dementia without behavioral disturbance: Secondary | ICD-10-CM | POA: Diagnosis not present

## 2020-04-02 DIAGNOSIS — L03115 Cellulitis of right lower limb: Secondary | ICD-10-CM | POA: Diagnosis not present

## 2020-04-02 DIAGNOSIS — M2550 Pain in unspecified joint: Secondary | ICD-10-CM | POA: Diagnosis not present

## 2020-04-02 DIAGNOSIS — I129 Hypertensive chronic kidney disease with stage 1 through stage 4 chronic kidney disease, or unspecified chronic kidney disease: Secondary | ICD-10-CM | POA: Diagnosis not present

## 2020-04-02 DIAGNOSIS — J690 Pneumonitis due to inhalation of food and vomit: Secondary | ICD-10-CM | POA: Diagnosis not present

## 2020-04-02 DIAGNOSIS — N183 Chronic kidney disease, stage 3 unspecified: Secondary | ICD-10-CM | POA: Diagnosis not present

## 2020-04-02 DIAGNOSIS — E1122 Type 2 diabetes mellitus with diabetic chronic kidney disease: Secondary | ICD-10-CM | POA: Diagnosis not present

## 2020-04-04 DIAGNOSIS — M2550 Pain in unspecified joint: Secondary | ICD-10-CM | POA: Diagnosis not present

## 2020-04-04 DIAGNOSIS — I129 Hypertensive chronic kidney disease with stage 1 through stage 4 chronic kidney disease, or unspecified chronic kidney disease: Secondary | ICD-10-CM | POA: Diagnosis not present

## 2020-04-04 DIAGNOSIS — Z20828 Contact with and (suspected) exposure to other viral communicable diseases: Secondary | ICD-10-CM | POA: Diagnosis not present

## 2020-04-04 DIAGNOSIS — L03115 Cellulitis of right lower limb: Secondary | ICD-10-CM | POA: Diagnosis not present

## 2020-04-04 DIAGNOSIS — J690 Pneumonitis due to inhalation of food and vomit: Secondary | ICD-10-CM | POA: Diagnosis not present

## 2020-04-04 DIAGNOSIS — E1122 Type 2 diabetes mellitus with diabetic chronic kidney disease: Secondary | ICD-10-CM | POA: Diagnosis not present

## 2020-04-04 DIAGNOSIS — N183 Chronic kidney disease, stage 3 unspecified: Secondary | ICD-10-CM | POA: Diagnosis not present

## 2020-04-09 DIAGNOSIS — L03115 Cellulitis of right lower limb: Secondary | ICD-10-CM | POA: Diagnosis not present

## 2020-04-09 DIAGNOSIS — I129 Hypertensive chronic kidney disease with stage 1 through stage 4 chronic kidney disease, or unspecified chronic kidney disease: Secondary | ICD-10-CM | POA: Diagnosis not present

## 2020-04-09 DIAGNOSIS — J690 Pneumonitis due to inhalation of food and vomit: Secondary | ICD-10-CM | POA: Diagnosis not present

## 2020-04-09 DIAGNOSIS — N183 Chronic kidney disease, stage 3 unspecified: Secondary | ICD-10-CM | POA: Diagnosis not present

## 2020-04-09 DIAGNOSIS — M2550 Pain in unspecified joint: Secondary | ICD-10-CM | POA: Diagnosis not present

## 2020-04-09 DIAGNOSIS — E1122 Type 2 diabetes mellitus with diabetic chronic kidney disease: Secondary | ICD-10-CM | POA: Diagnosis not present

## 2020-04-11 DIAGNOSIS — Z20828 Contact with and (suspected) exposure to other viral communicable diseases: Secondary | ICD-10-CM | POA: Diagnosis not present

## 2020-04-13 DIAGNOSIS — L03115 Cellulitis of right lower limb: Secondary | ICD-10-CM | POA: Diagnosis not present

## 2020-04-13 DIAGNOSIS — J690 Pneumonitis due to inhalation of food and vomit: Secondary | ICD-10-CM | POA: Diagnosis not present

## 2020-04-13 DIAGNOSIS — M2550 Pain in unspecified joint: Secondary | ICD-10-CM | POA: Diagnosis not present

## 2020-04-13 DIAGNOSIS — I129 Hypertensive chronic kidney disease with stage 1 through stage 4 chronic kidney disease, or unspecified chronic kidney disease: Secondary | ICD-10-CM | POA: Diagnosis not present

## 2020-04-13 DIAGNOSIS — E1122 Type 2 diabetes mellitus with diabetic chronic kidney disease: Secondary | ICD-10-CM | POA: Diagnosis not present

## 2020-04-13 DIAGNOSIS — N183 Chronic kidney disease, stage 3 unspecified: Secondary | ICD-10-CM | POA: Diagnosis not present

## 2020-04-15 DIAGNOSIS — I69322 Dysarthria following cerebral infarction: Secondary | ICD-10-CM | POA: Diagnosis not present

## 2020-04-15 DIAGNOSIS — M2550 Pain in unspecified joint: Secondary | ICD-10-CM | POA: Diagnosis not present

## 2020-04-15 DIAGNOSIS — J9610 Chronic respiratory failure, unspecified whether with hypoxia or hypercapnia: Secondary | ICD-10-CM | POA: Diagnosis not present

## 2020-04-15 DIAGNOSIS — L03115 Cellulitis of right lower limb: Secondary | ICD-10-CM | POA: Diagnosis not present

## 2020-04-15 DIAGNOSIS — I129 Hypertensive chronic kidney disease with stage 1 through stage 4 chronic kidney disease, or unspecified chronic kidney disease: Secondary | ICD-10-CM | POA: Diagnosis not present

## 2020-04-15 DIAGNOSIS — M1990 Unspecified osteoarthritis, unspecified site: Secondary | ICD-10-CM | POA: Diagnosis not present

## 2020-04-15 DIAGNOSIS — E1140 Type 2 diabetes mellitus with diabetic neuropathy, unspecified: Secondary | ICD-10-CM | POA: Diagnosis not present

## 2020-04-15 DIAGNOSIS — M810 Age-related osteoporosis without current pathological fracture: Secondary | ICD-10-CM | POA: Diagnosis not present

## 2020-04-15 DIAGNOSIS — E785 Hyperlipidemia, unspecified: Secondary | ICD-10-CM | POA: Diagnosis not present

## 2020-04-15 DIAGNOSIS — R1310 Dysphagia, unspecified: Secondary | ICD-10-CM | POA: Diagnosis not present

## 2020-04-15 DIAGNOSIS — J690 Pneumonitis due to inhalation of food and vomit: Secondary | ICD-10-CM | POA: Diagnosis not present

## 2020-04-15 DIAGNOSIS — N183 Chronic kidney disease, stage 3 unspecified: Secondary | ICD-10-CM | POA: Diagnosis not present

## 2020-04-15 DIAGNOSIS — E1122 Type 2 diabetes mellitus with diabetic chronic kidney disease: Secondary | ICD-10-CM | POA: Diagnosis not present

## 2020-04-16 DIAGNOSIS — N183 Chronic kidney disease, stage 3 unspecified: Secondary | ICD-10-CM | POA: Diagnosis not present

## 2020-04-16 DIAGNOSIS — M2550 Pain in unspecified joint: Secondary | ICD-10-CM | POA: Diagnosis not present

## 2020-04-16 DIAGNOSIS — L03115 Cellulitis of right lower limb: Secondary | ICD-10-CM | POA: Diagnosis not present

## 2020-04-16 DIAGNOSIS — E1122 Type 2 diabetes mellitus with diabetic chronic kidney disease: Secondary | ICD-10-CM | POA: Diagnosis not present

## 2020-04-16 DIAGNOSIS — J690 Pneumonitis due to inhalation of food and vomit: Secondary | ICD-10-CM | POA: Diagnosis not present

## 2020-04-16 DIAGNOSIS — I129 Hypertensive chronic kidney disease with stage 1 through stage 4 chronic kidney disease, or unspecified chronic kidney disease: Secondary | ICD-10-CM | POA: Diagnosis not present

## 2020-04-18 DIAGNOSIS — Z20828 Contact with and (suspected) exposure to other viral communicable diseases: Secondary | ICD-10-CM | POA: Diagnosis not present

## 2020-04-23 DIAGNOSIS — I129 Hypertensive chronic kidney disease with stage 1 through stage 4 chronic kidney disease, or unspecified chronic kidney disease: Secondary | ICD-10-CM | POA: Diagnosis not present

## 2020-04-23 DIAGNOSIS — L03115 Cellulitis of right lower limb: Secondary | ICD-10-CM | POA: Diagnosis not present

## 2020-04-23 DIAGNOSIS — M2550 Pain in unspecified joint: Secondary | ICD-10-CM | POA: Diagnosis not present

## 2020-04-23 DIAGNOSIS — E1122 Type 2 diabetes mellitus with diabetic chronic kidney disease: Secondary | ICD-10-CM | POA: Diagnosis not present

## 2020-04-23 DIAGNOSIS — N183 Chronic kidney disease, stage 3 unspecified: Secondary | ICD-10-CM | POA: Diagnosis not present

## 2020-04-23 DIAGNOSIS — J690 Pneumonitis due to inhalation of food and vomit: Secondary | ICD-10-CM | POA: Diagnosis not present

## 2020-04-25 DIAGNOSIS — Z20828 Contact with and (suspected) exposure to other viral communicable diseases: Secondary | ICD-10-CM | POA: Diagnosis not present

## 2020-04-27 DIAGNOSIS — R54 Age-related physical debility: Secondary | ICD-10-CM | POA: Diagnosis not present

## 2020-04-27 DIAGNOSIS — R1310 Dysphagia, unspecified: Secondary | ICD-10-CM | POA: Diagnosis not present

## 2020-04-30 DIAGNOSIS — J690 Pneumonitis due to inhalation of food and vomit: Secondary | ICD-10-CM | POA: Diagnosis not present

## 2020-04-30 DIAGNOSIS — N183 Chronic kidney disease, stage 3 unspecified: Secondary | ICD-10-CM | POA: Diagnosis not present

## 2020-04-30 DIAGNOSIS — I129 Hypertensive chronic kidney disease with stage 1 through stage 4 chronic kidney disease, or unspecified chronic kidney disease: Secondary | ICD-10-CM | POA: Diagnosis not present

## 2020-04-30 DIAGNOSIS — M2550 Pain in unspecified joint: Secondary | ICD-10-CM | POA: Diagnosis not present

## 2020-04-30 DIAGNOSIS — L03115 Cellulitis of right lower limb: Secondary | ICD-10-CM | POA: Diagnosis not present

## 2020-04-30 DIAGNOSIS — E1122 Type 2 diabetes mellitus with diabetic chronic kidney disease: Secondary | ICD-10-CM | POA: Diagnosis not present

## 2020-05-02 DIAGNOSIS — Z20828 Contact with and (suspected) exposure to other viral communicable diseases: Secondary | ICD-10-CM | POA: Diagnosis not present

## 2020-05-02 DIAGNOSIS — Z23 Encounter for immunization: Secondary | ICD-10-CM | POA: Diagnosis not present

## 2020-05-04 DIAGNOSIS — N390 Urinary tract infection, site not specified: Secondary | ICD-10-CM | POA: Diagnosis not present

## 2020-05-04 DIAGNOSIS — R509 Fever, unspecified: Secondary | ICD-10-CM | POA: Diagnosis not present

## 2020-05-04 DIAGNOSIS — Z8701 Personal history of pneumonia (recurrent): Secondary | ICD-10-CM | POA: Diagnosis not present

## 2020-05-04 DIAGNOSIS — R5381 Other malaise: Secondary | ICD-10-CM | POA: Diagnosis not present

## 2020-05-04 DIAGNOSIS — R0989 Other specified symptoms and signs involving the circulatory and respiratory systems: Secondary | ICD-10-CM | POA: Diagnosis not present

## 2020-05-04 DIAGNOSIS — R1030 Lower abdominal pain, unspecified: Secondary | ICD-10-CM | POA: Diagnosis not present

## 2020-05-04 DIAGNOSIS — F0390 Unspecified dementia without behavioral disturbance: Secondary | ICD-10-CM | POA: Diagnosis not present

## 2020-05-07 DIAGNOSIS — F0390 Unspecified dementia without behavioral disturbance: Secondary | ICD-10-CM | POA: Diagnosis not present

## 2020-05-07 DIAGNOSIS — R509 Fever, unspecified: Secondary | ICD-10-CM | POA: Diagnosis not present

## 2020-05-07 DIAGNOSIS — J189 Pneumonia, unspecified organism: Secondary | ICD-10-CM | POA: Diagnosis not present

## 2020-05-07 DIAGNOSIS — R5381 Other malaise: Secondary | ICD-10-CM | POA: Diagnosis not present

## 2020-05-09 DIAGNOSIS — M2550 Pain in unspecified joint: Secondary | ICD-10-CM | POA: Diagnosis not present

## 2020-05-09 DIAGNOSIS — E1122 Type 2 diabetes mellitus with diabetic chronic kidney disease: Secondary | ICD-10-CM | POA: Diagnosis not present

## 2020-05-09 DIAGNOSIS — L03115 Cellulitis of right lower limb: Secondary | ICD-10-CM | POA: Diagnosis not present

## 2020-05-09 DIAGNOSIS — I129 Hypertensive chronic kidney disease with stage 1 through stage 4 chronic kidney disease, or unspecified chronic kidney disease: Secondary | ICD-10-CM | POA: Diagnosis not present

## 2020-05-09 DIAGNOSIS — N183 Chronic kidney disease, stage 3 unspecified: Secondary | ICD-10-CM | POA: Diagnosis not present

## 2020-05-09 DIAGNOSIS — J690 Pneumonitis due to inhalation of food and vomit: Secondary | ICD-10-CM | POA: Diagnosis not present

## 2020-05-09 DIAGNOSIS — Z20828 Contact with and (suspected) exposure to other viral communicable diseases: Secondary | ICD-10-CM | POA: Diagnosis not present

## 2020-05-14 DIAGNOSIS — M2550 Pain in unspecified joint: Secondary | ICD-10-CM | POA: Diagnosis not present

## 2020-05-14 DIAGNOSIS — E1122 Type 2 diabetes mellitus with diabetic chronic kidney disease: Secondary | ICD-10-CM | POA: Diagnosis not present

## 2020-05-14 DIAGNOSIS — I129 Hypertensive chronic kidney disease with stage 1 through stage 4 chronic kidney disease, or unspecified chronic kidney disease: Secondary | ICD-10-CM | POA: Diagnosis not present

## 2020-05-14 DIAGNOSIS — J690 Pneumonitis due to inhalation of food and vomit: Secondary | ICD-10-CM | POA: Diagnosis not present

## 2020-05-14 DIAGNOSIS — L03115 Cellulitis of right lower limb: Secondary | ICD-10-CM | POA: Diagnosis not present

## 2020-05-14 DIAGNOSIS — N183 Chronic kidney disease, stage 3 unspecified: Secondary | ICD-10-CM | POA: Diagnosis not present

## 2020-05-15 DIAGNOSIS — N183 Chronic kidney disease, stage 3 unspecified: Secondary | ICD-10-CM | POA: Diagnosis not present

## 2020-05-15 DIAGNOSIS — I69322 Dysarthria following cerebral infarction: Secondary | ICD-10-CM | POA: Diagnosis not present

## 2020-05-15 DIAGNOSIS — L03115 Cellulitis of right lower limb: Secondary | ICD-10-CM | POA: Diagnosis not present

## 2020-05-15 DIAGNOSIS — E1122 Type 2 diabetes mellitus with diabetic chronic kidney disease: Secondary | ICD-10-CM | POA: Diagnosis not present

## 2020-05-15 DIAGNOSIS — J9610 Chronic respiratory failure, unspecified whether with hypoxia or hypercapnia: Secondary | ICD-10-CM | POA: Diagnosis not present

## 2020-05-15 DIAGNOSIS — J690 Pneumonitis due to inhalation of food and vomit: Secondary | ICD-10-CM | POA: Diagnosis not present

## 2020-05-15 DIAGNOSIS — M1990 Unspecified osteoarthritis, unspecified site: Secondary | ICD-10-CM | POA: Diagnosis not present

## 2020-05-15 DIAGNOSIS — E1140 Type 2 diabetes mellitus with diabetic neuropathy, unspecified: Secondary | ICD-10-CM | POA: Diagnosis not present

## 2020-05-15 DIAGNOSIS — I129 Hypertensive chronic kidney disease with stage 1 through stage 4 chronic kidney disease, or unspecified chronic kidney disease: Secondary | ICD-10-CM | POA: Diagnosis not present

## 2020-05-15 DIAGNOSIS — R1310 Dysphagia, unspecified: Secondary | ICD-10-CM | POA: Diagnosis not present

## 2020-05-15 DIAGNOSIS — M810 Age-related osteoporosis without current pathological fracture: Secondary | ICD-10-CM | POA: Diagnosis not present

## 2020-05-15 DIAGNOSIS — M2550 Pain in unspecified joint: Secondary | ICD-10-CM | POA: Diagnosis not present

## 2020-05-15 DIAGNOSIS — E785 Hyperlipidemia, unspecified: Secondary | ICD-10-CM | POA: Diagnosis not present

## 2020-05-16 DIAGNOSIS — Z20828 Contact with and (suspected) exposure to other viral communicable diseases: Secondary | ICD-10-CM | POA: Diagnosis not present

## 2020-05-21 DIAGNOSIS — N183 Chronic kidney disease, stage 3 unspecified: Secondary | ICD-10-CM | POA: Diagnosis not present

## 2020-05-21 DIAGNOSIS — E1122 Type 2 diabetes mellitus with diabetic chronic kidney disease: Secondary | ICD-10-CM | POA: Diagnosis not present

## 2020-05-21 DIAGNOSIS — L03115 Cellulitis of right lower limb: Secondary | ICD-10-CM | POA: Diagnosis not present

## 2020-05-21 DIAGNOSIS — J690 Pneumonitis due to inhalation of food and vomit: Secondary | ICD-10-CM | POA: Diagnosis not present

## 2020-05-21 DIAGNOSIS — I129 Hypertensive chronic kidney disease with stage 1 through stage 4 chronic kidney disease, or unspecified chronic kidney disease: Secondary | ICD-10-CM | POA: Diagnosis not present

## 2020-05-21 DIAGNOSIS — M2550 Pain in unspecified joint: Secondary | ICD-10-CM | POA: Diagnosis not present

## 2020-05-28 DIAGNOSIS — L03115 Cellulitis of right lower limb: Secondary | ICD-10-CM | POA: Diagnosis not present

## 2020-05-28 DIAGNOSIS — I129 Hypertensive chronic kidney disease with stage 1 through stage 4 chronic kidney disease, or unspecified chronic kidney disease: Secondary | ICD-10-CM | POA: Diagnosis not present

## 2020-05-28 DIAGNOSIS — J690 Pneumonitis due to inhalation of food and vomit: Secondary | ICD-10-CM | POA: Diagnosis not present

## 2020-05-28 DIAGNOSIS — M2550 Pain in unspecified joint: Secondary | ICD-10-CM | POA: Diagnosis not present

## 2020-05-28 DIAGNOSIS — N183 Chronic kidney disease, stage 3 unspecified: Secondary | ICD-10-CM | POA: Diagnosis not present

## 2020-05-28 DIAGNOSIS — E1122 Type 2 diabetes mellitus with diabetic chronic kidney disease: Secondary | ICD-10-CM | POA: Diagnosis not present

## 2020-06-04 DIAGNOSIS — M2550 Pain in unspecified joint: Secondary | ICD-10-CM | POA: Diagnosis not present

## 2020-06-04 DIAGNOSIS — I129 Hypertensive chronic kidney disease with stage 1 through stage 4 chronic kidney disease, or unspecified chronic kidney disease: Secondary | ICD-10-CM | POA: Diagnosis not present

## 2020-06-04 DIAGNOSIS — E1122 Type 2 diabetes mellitus with diabetic chronic kidney disease: Secondary | ICD-10-CM | POA: Diagnosis not present

## 2020-06-04 DIAGNOSIS — N183 Chronic kidney disease, stage 3 unspecified: Secondary | ICD-10-CM | POA: Diagnosis not present

## 2020-06-04 DIAGNOSIS — J690 Pneumonitis due to inhalation of food and vomit: Secondary | ICD-10-CM | POA: Diagnosis not present

## 2020-06-04 DIAGNOSIS — L03115 Cellulitis of right lower limb: Secondary | ICD-10-CM | POA: Diagnosis not present

## 2020-06-06 DIAGNOSIS — W19XXXA Unspecified fall, initial encounter: Secondary | ICD-10-CM | POA: Diagnosis not present

## 2020-06-06 DIAGNOSIS — Z9181 History of falling: Secondary | ICD-10-CM | POA: Diagnosis not present

## 2020-06-06 DIAGNOSIS — F0390 Unspecified dementia without behavioral disturbance: Secondary | ICD-10-CM | POA: Diagnosis not present

## 2020-06-06 DIAGNOSIS — R269 Unspecified abnormalities of gait and mobility: Secondary | ICD-10-CM | POA: Diagnosis not present

## 2020-06-06 DIAGNOSIS — M6281 Muscle weakness (generalized): Secondary | ICD-10-CM | POA: Diagnosis not present

## 2020-06-06 DIAGNOSIS — S8001XA Contusion of right knee, initial encounter: Secondary | ICD-10-CM | POA: Diagnosis not present

## 2020-06-06 DIAGNOSIS — R2689 Other abnormalities of gait and mobility: Secondary | ICD-10-CM | POA: Diagnosis not present

## 2020-06-18 DIAGNOSIS — R531 Weakness: Secondary | ICD-10-CM | POA: Diagnosis not present

## 2020-06-18 DIAGNOSIS — R509 Fever, unspecified: Secondary | ICD-10-CM | POA: Diagnosis not present

## 2020-06-18 DIAGNOSIS — R0902 Hypoxemia: Secondary | ICD-10-CM | POA: Diagnosis not present

## 2020-06-18 DIAGNOSIS — J189 Pneumonia, unspecified organism: Secondary | ICD-10-CM | POA: Diagnosis not present

## 2020-06-18 DIAGNOSIS — J181 Lobar pneumonia, unspecified organism: Secondary | ICD-10-CM | POA: Diagnosis not present

## 2020-06-20 DIAGNOSIS — F0390 Unspecified dementia without behavioral disturbance: Secondary | ICD-10-CM | POA: Diagnosis not present

## 2020-06-20 DIAGNOSIS — R509 Fever, unspecified: Secondary | ICD-10-CM | POA: Diagnosis not present

## 2020-06-20 DIAGNOSIS — M6281 Muscle weakness (generalized): Secondary | ICD-10-CM | POA: Diagnosis not present

## 2020-06-20 DIAGNOSIS — Z8701 Personal history of pneumonia (recurrent): Secondary | ICD-10-CM | POA: Diagnosis not present

## 2020-06-20 DIAGNOSIS — R269 Unspecified abnormalities of gait and mobility: Secondary | ICD-10-CM | POA: Diagnosis not present

## 2020-06-20 DIAGNOSIS — J189 Pneumonia, unspecified organism: Secondary | ICD-10-CM | POA: Diagnosis not present

## 2020-06-27 DIAGNOSIS — E1122 Type 2 diabetes mellitus with diabetic chronic kidney disease: Secondary | ICD-10-CM | POA: Diagnosis not present

## 2020-06-27 DIAGNOSIS — G894 Chronic pain syndrome: Secondary | ICD-10-CM | POA: Diagnosis not present

## 2020-06-27 DIAGNOSIS — F0390 Unspecified dementia without behavioral disturbance: Secondary | ICD-10-CM | POA: Diagnosis not present

## 2020-06-27 DIAGNOSIS — N1831 Chronic kidney disease, stage 3a: Secondary | ICD-10-CM | POA: Diagnosis not present

## 2020-06-27 DIAGNOSIS — I129 Hypertensive chronic kidney disease with stage 1 through stage 4 chronic kidney disease, or unspecified chronic kidney disease: Secondary | ICD-10-CM | POA: Diagnosis not present

## 2020-06-27 DIAGNOSIS — M1990 Unspecified osteoarthritis, unspecified site: Secondary | ICD-10-CM | POA: Diagnosis not present

## 2020-06-28 DIAGNOSIS — R52 Pain, unspecified: Secondary | ICD-10-CM | POA: Diagnosis not present

## 2020-06-28 DIAGNOSIS — E119 Type 2 diabetes mellitus without complications: Secondary | ICD-10-CM | POA: Diagnosis not present

## 2020-06-28 DIAGNOSIS — H00014 Hordeolum externum left upper eyelid: Secondary | ICD-10-CM | POA: Diagnosis not present

## 2020-06-28 DIAGNOSIS — D649 Anemia, unspecified: Secondary | ICD-10-CM | POA: Diagnosis not present

## 2020-06-28 DIAGNOSIS — H02844 Edema of left upper eyelid: Secondary | ICD-10-CM | POA: Diagnosis not present

## 2020-06-28 DIAGNOSIS — F0390 Unspecified dementia without behavioral disturbance: Secondary | ICD-10-CM | POA: Diagnosis not present

## 2020-07-16 DIAGNOSIS — S81801A Unspecified open wound, right lower leg, initial encounter: Secondary | ICD-10-CM | POA: Diagnosis not present

## 2020-07-16 DIAGNOSIS — L03115 Cellulitis of right lower limb: Secondary | ICD-10-CM | POA: Diagnosis not present

## 2020-07-16 DIAGNOSIS — R600 Localized edema: Secondary | ICD-10-CM | POA: Diagnosis not present

## 2020-07-16 DIAGNOSIS — F0390 Unspecified dementia without behavioral disturbance: Secondary | ICD-10-CM | POA: Diagnosis not present

## 2020-07-19 DIAGNOSIS — R2681 Unsteadiness on feet: Secondary | ICD-10-CM | POA: Diagnosis not present

## 2020-07-19 DIAGNOSIS — Z8673 Personal history of transient ischemic attack (TIA), and cerebral infarction without residual deficits: Secondary | ICD-10-CM | POA: Diagnosis not present

## 2020-07-19 DIAGNOSIS — Z792 Long term (current) use of antibiotics: Secondary | ICD-10-CM | POA: Diagnosis not present

## 2020-07-19 DIAGNOSIS — M1990 Unspecified osteoarthritis, unspecified site: Secondary | ICD-10-CM | POA: Diagnosis not present

## 2020-07-19 DIAGNOSIS — L03115 Cellulitis of right lower limb: Secondary | ICD-10-CM | POA: Diagnosis not present

## 2020-07-19 DIAGNOSIS — R600 Localized edema: Secondary | ICD-10-CM | POA: Diagnosis not present

## 2020-07-19 DIAGNOSIS — F0390 Unspecified dementia without behavioral disturbance: Secondary | ICD-10-CM | POA: Diagnosis not present

## 2020-07-19 DIAGNOSIS — Z7902 Long term (current) use of antithrombotics/antiplatelets: Secondary | ICD-10-CM | POA: Diagnosis not present

## 2020-07-19 DIAGNOSIS — R1310 Dysphagia, unspecified: Secondary | ICD-10-CM | POA: Diagnosis not present

## 2020-07-19 DIAGNOSIS — Z79899 Other long term (current) drug therapy: Secondary | ICD-10-CM | POA: Diagnosis not present

## 2020-07-19 DIAGNOSIS — I10 Essential (primary) hypertension: Secondary | ICD-10-CM | POA: Diagnosis not present

## 2020-07-19 DIAGNOSIS — R54 Age-related physical debility: Secondary | ICD-10-CM | POA: Diagnosis not present

## 2020-07-19 DIAGNOSIS — I69322 Dysarthria following cerebral infarction: Secondary | ICD-10-CM | POA: Diagnosis not present

## 2020-07-19 DIAGNOSIS — E785 Hyperlipidemia, unspecified: Secondary | ICD-10-CM | POA: Diagnosis not present

## 2020-07-20 DIAGNOSIS — M79604 Pain in right leg: Secondary | ICD-10-CM | POA: Diagnosis not present

## 2020-07-20 DIAGNOSIS — R410 Disorientation, unspecified: Secondary | ICD-10-CM | POA: Diagnosis not present

## 2020-07-20 DIAGNOSIS — R509 Fever, unspecified: Secondary | ICD-10-CM | POA: Diagnosis not present

## 2020-07-20 DIAGNOSIS — M7989 Other specified soft tissue disorders: Secondary | ICD-10-CM | POA: Diagnosis not present

## 2020-07-20 DIAGNOSIS — M79661 Pain in right lower leg: Secondary | ICD-10-CM | POA: Diagnosis not present

## 2020-07-20 DIAGNOSIS — R0689 Other abnormalities of breathing: Secondary | ICD-10-CM | POA: Diagnosis not present

## 2020-07-20 DIAGNOSIS — J189 Pneumonia, unspecified organism: Secondary | ICD-10-CM | POA: Diagnosis not present

## 2020-07-20 DIAGNOSIS — J9811 Atelectasis: Secondary | ICD-10-CM | POA: Diagnosis not present

## 2020-07-20 DIAGNOSIS — R404 Transient alteration of awareness: Secondary | ICD-10-CM | POA: Diagnosis not present

## 2020-07-20 DIAGNOSIS — L03115 Cellulitis of right lower limb: Secondary | ICD-10-CM | POA: Diagnosis not present

## 2020-07-20 DIAGNOSIS — R0902 Hypoxemia: Secondary | ICD-10-CM | POA: Diagnosis not present

## 2020-07-21 DIAGNOSIS — Z66 Do not resuscitate: Secondary | ICD-10-CM | POA: Diagnosis present

## 2020-07-21 DIAGNOSIS — E785 Hyperlipidemia, unspecified: Secondary | ICD-10-CM | POA: Diagnosis not present

## 2020-07-21 DIAGNOSIS — N183 Chronic kidney disease, stage 3 unspecified: Secondary | ICD-10-CM | POA: Diagnosis not present

## 2020-07-21 DIAGNOSIS — I10 Essential (primary) hypertension: Secondary | ICD-10-CM | POA: Diagnosis not present

## 2020-07-21 DIAGNOSIS — R2689 Other abnormalities of gait and mobility: Secondary | ICD-10-CM | POA: Diagnosis not present

## 2020-07-21 DIAGNOSIS — R0989 Other specified symptoms and signs involving the circulatory and respiratory systems: Secondary | ICD-10-CM | POA: Diagnosis not present

## 2020-07-21 DIAGNOSIS — G9009 Other idiopathic peripheral autonomic neuropathy: Secondary | ICD-10-CM | POA: Diagnosis not present

## 2020-07-21 DIAGNOSIS — Z79899 Other long term (current) drug therapy: Secondary | ICD-10-CM | POA: Diagnosis not present

## 2020-07-21 DIAGNOSIS — J189 Pneumonia, unspecified organism: Secondary | ICD-10-CM | POA: Diagnosis not present

## 2020-07-21 DIAGNOSIS — Z7902 Long term (current) use of antithrombotics/antiplatelets: Secondary | ICD-10-CM | POA: Diagnosis not present

## 2020-07-21 DIAGNOSIS — M79661 Pain in right lower leg: Secondary | ICD-10-CM | POA: Diagnosis not present

## 2020-07-21 DIAGNOSIS — R1312 Dysphagia, oropharyngeal phase: Secondary | ICD-10-CM | POA: Diagnosis not present

## 2020-07-21 DIAGNOSIS — R0602 Shortness of breath: Secondary | ICD-10-CM | POA: Diagnosis not present

## 2020-07-21 DIAGNOSIS — Z743 Need for continuous supervision: Secondary | ICD-10-CM | POA: Diagnosis not present

## 2020-07-21 DIAGNOSIS — J9811 Atelectasis: Secondary | ICD-10-CM | POA: Diagnosis not present

## 2020-07-21 DIAGNOSIS — G9341 Metabolic encephalopathy: Secondary | ICD-10-CM | POA: Diagnosis not present

## 2020-07-21 DIAGNOSIS — I639 Cerebral infarction, unspecified: Secondary | ICD-10-CM | POA: Diagnosis not present

## 2020-07-21 DIAGNOSIS — M79604 Pain in right leg: Secondary | ICD-10-CM | POA: Diagnosis not present

## 2020-07-21 DIAGNOSIS — N189 Chronic kidney disease, unspecified: Secondary | ICD-10-CM | POA: Diagnosis present

## 2020-07-21 DIAGNOSIS — R509 Fever, unspecified: Secondary | ICD-10-CM | POA: Diagnosis not present

## 2020-07-21 DIAGNOSIS — L03115 Cellulitis of right lower limb: Secondary | ICD-10-CM | POA: Diagnosis not present

## 2020-07-21 DIAGNOSIS — M7989 Other specified soft tissue disorders: Secondary | ICD-10-CM | POA: Diagnosis not present

## 2020-07-21 DIAGNOSIS — J690 Pneumonitis due to inhalation of food and vomit: Secondary | ICD-10-CM | POA: Diagnosis not present

## 2020-07-21 DIAGNOSIS — Z9181 History of falling: Secondary | ICD-10-CM | POA: Diagnosis not present

## 2020-07-21 DIAGNOSIS — M6281 Muscle weakness (generalized): Secondary | ICD-10-CM | POA: Diagnosis not present

## 2020-07-21 DIAGNOSIS — R278 Other lack of coordination: Secondary | ICD-10-CM | POA: Diagnosis not present

## 2020-07-21 DIAGNOSIS — M1990 Unspecified osteoarthritis, unspecified site: Secondary | ICD-10-CM | POA: Diagnosis present

## 2020-07-21 DIAGNOSIS — Z8673 Personal history of transient ischemic attack (TIA), and cerebral infarction without residual deficits: Secondary | ICD-10-CM | POA: Diagnosis not present

## 2020-07-21 DIAGNOSIS — I129 Hypertensive chronic kidney disease with stage 1 through stage 4 chronic kidney disease, or unspecified chronic kidney disease: Secondary | ICD-10-CM | POA: Diagnosis present

## 2020-07-21 DIAGNOSIS — R531 Weakness: Secondary | ICD-10-CM | POA: Diagnosis not present

## 2020-07-21 DIAGNOSIS — M818 Other osteoporosis without current pathological fracture: Secondary | ICD-10-CM | POA: Diagnosis not present

## 2020-07-21 DIAGNOSIS — Z792 Long term (current) use of antibiotics: Secondary | ICD-10-CM | POA: Diagnosis not present

## 2020-07-24 DIAGNOSIS — L03115 Cellulitis of right lower limb: Secondary | ICD-10-CM | POA: Diagnosis not present

## 2020-07-24 DIAGNOSIS — R059 Cough, unspecified: Secondary | ICD-10-CM | POA: Diagnosis not present

## 2020-07-24 DIAGNOSIS — N1831 Chronic kidney disease, stage 3a: Secondary | ICD-10-CM | POA: Diagnosis not present

## 2020-07-24 DIAGNOSIS — J189 Pneumonia, unspecified organism: Secondary | ICD-10-CM | POA: Diagnosis not present

## 2020-07-24 DIAGNOSIS — R531 Weakness: Secondary | ICD-10-CM | POA: Diagnosis not present

## 2020-07-24 DIAGNOSIS — M818 Other osteoporosis without current pathological fracture: Secondary | ICD-10-CM | POA: Diagnosis not present

## 2020-07-24 DIAGNOSIS — M6281 Muscle weakness (generalized): Secondary | ICD-10-CM | POA: Diagnosis not present

## 2020-07-24 DIAGNOSIS — G9341 Metabolic encephalopathy: Secondary | ICD-10-CM | POA: Diagnosis not present

## 2020-07-24 DIAGNOSIS — G9009 Other idiopathic peripheral autonomic neuropathy: Secondary | ICD-10-CM | POA: Diagnosis not present

## 2020-07-24 DIAGNOSIS — I10 Essential (primary) hypertension: Secondary | ICD-10-CM | POA: Diagnosis not present

## 2020-07-24 DIAGNOSIS — R1312 Dysphagia, oropharyngeal phase: Secondary | ICD-10-CM | POA: Diagnosis not present

## 2020-07-24 DIAGNOSIS — R2689 Other abnormalities of gait and mobility: Secondary | ICD-10-CM | POA: Diagnosis not present

## 2020-07-24 DIAGNOSIS — R278 Other lack of coordination: Secondary | ICD-10-CM | POA: Diagnosis not present

## 2020-07-24 DIAGNOSIS — Z743 Need for continuous supervision: Secondary | ICD-10-CM | POA: Diagnosis not present

## 2020-07-24 DIAGNOSIS — E785 Hyperlipidemia, unspecified: Secondary | ICD-10-CM | POA: Diagnosis not present

## 2020-07-24 DIAGNOSIS — N183 Chronic kidney disease, stage 3 unspecified: Secondary | ICD-10-CM | POA: Diagnosis not present

## 2020-07-24 DIAGNOSIS — L89613 Pressure ulcer of right heel, stage 3: Secondary | ICD-10-CM | POA: Diagnosis not present

## 2020-07-24 DIAGNOSIS — I639 Cerebral infarction, unspecified: Secondary | ICD-10-CM | POA: Diagnosis not present

## 2020-07-24 DIAGNOSIS — J690 Pneumonitis due to inhalation of food and vomit: Secondary | ICD-10-CM | POA: Diagnosis not present

## 2020-07-24 DIAGNOSIS — R262 Difficulty in walking, not elsewhere classified: Secondary | ICD-10-CM | POA: Diagnosis not present

## 2020-07-28 DIAGNOSIS — N1831 Chronic kidney disease, stage 3a: Secondary | ICD-10-CM | POA: Diagnosis not present

## 2020-07-28 DIAGNOSIS — R262 Difficulty in walking, not elsewhere classified: Secondary | ICD-10-CM | POA: Diagnosis not present

## 2020-07-28 DIAGNOSIS — L03115 Cellulitis of right lower limb: Secondary | ICD-10-CM | POA: Diagnosis not present

## 2020-07-28 DIAGNOSIS — J189 Pneumonia, unspecified organism: Secondary | ICD-10-CM | POA: Diagnosis not present

## 2020-08-02 ENCOUNTER — Other Ambulatory Visit: Payer: Self-pay | Admitting: *Deleted

## 2020-08-02 NOTE — Patient Outreach (Signed)
Member screened for potential [facility] Care Management needs as a benefit of [place] Medicare.  Verified in [place] (Patient Gary Gibbs) that member resides in [place] [facility]. Communication sent to facility SW to inquire about transition plans and potential [facility] Care Management needs.    Marthenia Rolling, MSN, RN,BSN [place] Acute Care Coordinator [phone] [facility]) [phone]  (Toll free office)

## 2020-08-03 ENCOUNTER — Other Ambulatory Visit: Payer: Self-pay | Admitting: *Deleted

## 2020-08-03 NOTE — Patient Outreach (Signed)
[facility] Care Coordinator follow up. Member screened for potential [facility] Care Management needs as a benefit of [place] Medicare.  Update received from [facility] reporting member will return to ALF upon SNF dc. Unsure of transition date.   Will continue to follow while member resides in SNF.    Marthenia Rolling, MSN, RN,BSN [place] Acute Care Coordinator [phone] [facility]) [phone]  (Toll free office)

## 2020-08-14 DIAGNOSIS — L89613 Pressure ulcer of right heel, stage 3: Secondary | ICD-10-CM | POA: Diagnosis not present

## 2020-08-18 DIAGNOSIS — M818 Other osteoporosis without current pathological fracture: Secondary | ICD-10-CM | POA: Diagnosis not present

## 2020-08-18 DIAGNOSIS — I639 Cerebral infarction, unspecified: Secondary | ICD-10-CM | POA: Diagnosis not present

## 2020-08-18 DIAGNOSIS — I10 Essential (primary) hypertension: Secondary | ICD-10-CM | POA: Diagnosis not present

## 2020-08-18 DIAGNOSIS — G9341 Metabolic encephalopathy: Secondary | ICD-10-CM | POA: Diagnosis not present

## 2020-08-18 DIAGNOSIS — M6281 Muscle weakness (generalized): Secondary | ICD-10-CM | POA: Diagnosis not present

## 2020-08-18 DIAGNOSIS — N183 Chronic kidney disease, stage 3 unspecified: Secondary | ICD-10-CM | POA: Diagnosis not present

## 2020-08-18 DIAGNOSIS — L89613 Pressure ulcer of right heel, stage 3: Secondary | ICD-10-CM | POA: Diagnosis not present

## 2020-08-18 DIAGNOSIS — R1312 Dysphagia, oropharyngeal phase: Secondary | ICD-10-CM | POA: Diagnosis not present

## 2020-08-18 DIAGNOSIS — R2689 Other abnormalities of gait and mobility: Secondary | ICD-10-CM | POA: Diagnosis not present

## 2020-08-18 DIAGNOSIS — R531 Weakness: Secondary | ICD-10-CM | POA: Diagnosis not present

## 2020-08-18 DIAGNOSIS — J189 Pneumonia, unspecified organism: Secondary | ICD-10-CM | POA: Diagnosis not present

## 2020-08-18 DIAGNOSIS — L03115 Cellulitis of right lower limb: Secondary | ICD-10-CM | POA: Diagnosis not present

## 2020-08-18 DIAGNOSIS — R278 Other lack of coordination: Secondary | ICD-10-CM | POA: Diagnosis not present

## 2020-08-18 DIAGNOSIS — G9009 Other idiopathic peripheral autonomic neuropathy: Secondary | ICD-10-CM | POA: Diagnosis not present

## 2020-08-18 DIAGNOSIS — E785 Hyperlipidemia, unspecified: Secondary | ICD-10-CM | POA: Diagnosis not present

## 2020-08-21 DIAGNOSIS — L89613 Pressure ulcer of right heel, stage 3: Secondary | ICD-10-CM | POA: Diagnosis not present

## 2020-08-28 DIAGNOSIS — L89613 Pressure ulcer of right heel, stage 3: Secondary | ICD-10-CM | POA: Diagnosis not present

## 2020-08-28 DIAGNOSIS — G4483 Primary cough headache: Secondary | ICD-10-CM | POA: Diagnosis not present

## 2020-08-28 DIAGNOSIS — R509 Fever, unspecified: Secondary | ICD-10-CM | POA: Diagnosis not present

## 2020-08-28 DIAGNOSIS — N183 Chronic kidney disease, stage 3 unspecified: Secondary | ICD-10-CM | POA: Diagnosis not present

## 2020-08-28 DIAGNOSIS — N390 Urinary tract infection, site not specified: Secondary | ICD-10-CM | POA: Diagnosis not present

## 2020-08-28 DIAGNOSIS — Z20828 Contact with and (suspected) exposure to other viral communicable diseases: Secondary | ICD-10-CM | POA: Diagnosis not present

## 2020-08-29 DIAGNOSIS — I639 Cerebral infarction, unspecified: Secondary | ICD-10-CM | POA: Diagnosis not present

## 2020-08-29 DIAGNOSIS — I119 Hypertensive heart disease without heart failure: Secondary | ICD-10-CM | POA: Diagnosis not present

## 2020-08-29 DIAGNOSIS — E441 Mild protein-calorie malnutrition: Secondary | ICD-10-CM | POA: Diagnosis not present

## 2020-08-29 DIAGNOSIS — R5381 Other malaise: Secondary | ICD-10-CM | POA: Diagnosis not present

## 2020-09-04 DIAGNOSIS — A481 Legionnaires' disease: Secondary | ICD-10-CM | POA: Diagnosis not present

## 2020-09-04 DIAGNOSIS — E119 Type 2 diabetes mellitus without complications: Secondary | ICD-10-CM | POA: Diagnosis not present

## 2020-09-04 DIAGNOSIS — I10 Essential (primary) hypertension: Secondary | ICD-10-CM | POA: Diagnosis not present

## 2020-09-04 DIAGNOSIS — Z20828 Contact with and (suspected) exposure to other viral communicable diseases: Secondary | ICD-10-CM | POA: Diagnosis not present

## 2020-09-05 DIAGNOSIS — L89613 Pressure ulcer of right heel, stage 3: Secondary | ICD-10-CM | POA: Diagnosis not present

## 2020-09-08 DIAGNOSIS — L89619 Pressure ulcer of right heel, unspecified stage: Secondary | ICD-10-CM | POA: Diagnosis not present

## 2020-09-08 DIAGNOSIS — E860 Dehydration: Secondary | ICD-10-CM | POA: Diagnosis not present

## 2020-09-08 DIAGNOSIS — J690 Pneumonitis due to inhalation of food and vomit: Secondary | ICD-10-CM | POA: Diagnosis not present

## 2020-09-08 DIAGNOSIS — G309 Alzheimer's disease, unspecified: Secondary | ICD-10-CM | POA: Diagnosis not present

## 2020-09-08 DIAGNOSIS — F339 Major depressive disorder, recurrent, unspecified: Secondary | ICD-10-CM | POA: Diagnosis not present

## 2020-09-08 DIAGNOSIS — M1990 Unspecified osteoarthritis, unspecified site: Secondary | ICD-10-CM | POA: Diagnosis not present

## 2020-09-08 DIAGNOSIS — E46 Unspecified protein-calorie malnutrition: Secondary | ICD-10-CM | POA: Diagnosis not present

## 2020-09-08 DIAGNOSIS — L89629 Pressure ulcer of left heel, unspecified stage: Secondary | ICD-10-CM | POA: Diagnosis not present

## 2020-09-08 DIAGNOSIS — R1310 Dysphagia, unspecified: Secondary | ICD-10-CM | POA: Diagnosis not present

## 2020-09-12 DIAGNOSIS — L89613 Pressure ulcer of right heel, stage 3: Secondary | ICD-10-CM | POA: Diagnosis not present

## 2020-09-14 DIAGNOSIS — E860 Dehydration: Secondary | ICD-10-CM | POA: Diagnosis not present

## 2020-09-14 DIAGNOSIS — I10 Essential (primary) hypertension: Secondary | ICD-10-CM | POA: Diagnosis not present

## 2020-09-19 DIAGNOSIS — F339 Major depressive disorder, recurrent, unspecified: Secondary | ICD-10-CM | POA: Diagnosis not present

## 2020-09-19 DIAGNOSIS — M79605 Pain in left leg: Secondary | ICD-10-CM | POA: Diagnosis not present

## 2020-09-19 DIAGNOSIS — L89613 Pressure ulcer of right heel, stage 3: Secondary | ICD-10-CM | POA: Diagnosis not present

## 2020-09-19 DIAGNOSIS — M79604 Pain in right leg: Secondary | ICD-10-CM | POA: Diagnosis not present

## 2020-09-25 DIAGNOSIS — E119 Type 2 diabetes mellitus without complications: Secondary | ICD-10-CM | POA: Diagnosis not present

## 2020-09-25 DIAGNOSIS — R69 Illness, unspecified: Secondary | ICD-10-CM | POA: Diagnosis not present

## 2020-09-26 DIAGNOSIS — R0989 Other specified symptoms and signs involving the circulatory and respiratory systems: Secondary | ICD-10-CM | POA: Diagnosis not present

## 2020-09-26 DIAGNOSIS — R451 Restlessness and agitation: Secondary | ICD-10-CM | POA: Diagnosis not present

## 2020-09-26 DIAGNOSIS — L89613 Pressure ulcer of right heel, stage 3: Secondary | ICD-10-CM | POA: Diagnosis not present

## 2020-09-26 DIAGNOSIS — F0391 Unspecified dementia with behavioral disturbance: Secondary | ICD-10-CM | POA: Diagnosis not present

## 2020-09-26 DIAGNOSIS — F330 Major depressive disorder, recurrent, mild: Secondary | ICD-10-CM | POA: Diagnosis not present

## 2020-10-03 DIAGNOSIS — K5900 Constipation, unspecified: Secondary | ICD-10-CM | POA: Diagnosis not present

## 2020-10-03 DIAGNOSIS — I2510 Atherosclerotic heart disease of native coronary artery without angina pectoris: Secondary | ICD-10-CM | POA: Diagnosis not present

## 2020-10-03 DIAGNOSIS — E559 Vitamin D deficiency, unspecified: Secondary | ICD-10-CM | POA: Diagnosis not present

## 2020-10-03 DIAGNOSIS — R2689 Other abnormalities of gait and mobility: Secondary | ICD-10-CM | POA: Diagnosis not present

## 2020-10-03 DIAGNOSIS — R0600 Dyspnea, unspecified: Secondary | ICD-10-CM | POA: Diagnosis not present

## 2020-10-03 DIAGNOSIS — M6281 Muscle weakness (generalized): Secondary | ICD-10-CM | POA: Diagnosis not present

## 2020-10-03 DIAGNOSIS — L89613 Pressure ulcer of right heel, stage 3: Secondary | ICD-10-CM | POA: Diagnosis not present

## 2020-10-03 DIAGNOSIS — N179 Acute kidney failure, unspecified: Secondary | ICD-10-CM | POA: Diagnosis not present

## 2020-10-03 DIAGNOSIS — N183 Chronic kidney disease, stage 3 unspecified: Secondary | ICD-10-CM | POA: Diagnosis not present

## 2020-10-03 DIAGNOSIS — J189 Pneumonia, unspecified organism: Secondary | ICD-10-CM | POA: Diagnosis not present

## 2020-10-03 DIAGNOSIS — H04123 Dry eye syndrome of bilateral lacrimal glands: Secondary | ICD-10-CM | POA: Diagnosis not present

## 2020-10-03 DIAGNOSIS — M1990 Unspecified osteoarthritis, unspecified site: Secondary | ICD-10-CM | POA: Diagnosis not present

## 2020-10-03 DIAGNOSIS — R1312 Dysphagia, oropharyngeal phase: Secondary | ICD-10-CM | POA: Diagnosis not present

## 2020-10-03 DIAGNOSIS — E785 Hyperlipidemia, unspecified: Secondary | ICD-10-CM | POA: Diagnosis not present

## 2020-10-03 DIAGNOSIS — M818 Other osteoporosis without current pathological fracture: Secondary | ICD-10-CM | POA: Diagnosis not present

## 2020-10-03 DIAGNOSIS — R278 Other lack of coordination: Secondary | ICD-10-CM | POA: Diagnosis not present

## 2020-10-03 DIAGNOSIS — E46 Unspecified protein-calorie malnutrition: Secondary | ICD-10-CM | POA: Diagnosis not present

## 2020-10-03 DIAGNOSIS — R2681 Unsteadiness on feet: Secondary | ICD-10-CM | POA: Diagnosis not present

## 2020-10-03 DIAGNOSIS — G9009 Other idiopathic peripheral autonomic neuropathy: Secondary | ICD-10-CM | POA: Diagnosis not present

## 2020-10-03 DIAGNOSIS — G309 Alzheimer's disease, unspecified: Secondary | ICD-10-CM | POA: Diagnosis not present

## 2020-10-03 DIAGNOSIS — R002 Palpitations: Secondary | ICD-10-CM | POA: Diagnosis not present

## 2020-10-03 DIAGNOSIS — L03115 Cellulitis of right lower limb: Secondary | ICD-10-CM | POA: Diagnosis not present

## 2020-10-03 DIAGNOSIS — Z932 Ileostomy status: Secondary | ICD-10-CM | POA: Diagnosis not present

## 2020-10-03 DIAGNOSIS — F0280 Dementia in other diseases classified elsewhere without behavioral disturbance: Secondary | ICD-10-CM | POA: Diagnosis not present

## 2020-10-03 DIAGNOSIS — D649 Anemia, unspecified: Secondary | ICD-10-CM | POA: Diagnosis not present

## 2020-10-03 DIAGNOSIS — Z85038 Personal history of other malignant neoplasm of large intestine: Secondary | ICD-10-CM | POA: Diagnosis not present

## 2020-10-03 DIAGNOSIS — E039 Hypothyroidism, unspecified: Secondary | ICD-10-CM | POA: Diagnosis not present

## 2020-10-03 DIAGNOSIS — I639 Cerebral infarction, unspecified: Secondary | ICD-10-CM | POA: Diagnosis not present

## 2020-10-03 DIAGNOSIS — B356 Tinea cruris: Secondary | ICD-10-CM | POA: Diagnosis not present

## 2020-10-03 DIAGNOSIS — R531 Weakness: Secondary | ICD-10-CM | POA: Diagnosis not present

## 2020-10-03 DIAGNOSIS — G9341 Metabolic encephalopathy: Secondary | ICD-10-CM | POA: Diagnosis not present

## 2020-10-03 DIAGNOSIS — I10 Essential (primary) hypertension: Secondary | ICD-10-CM | POA: Diagnosis not present

## 2020-10-03 DIAGNOSIS — G629 Polyneuropathy, unspecified: Secondary | ICD-10-CM | POA: Diagnosis not present

## 2020-10-03 DIAGNOSIS — I739 Peripheral vascular disease, unspecified: Secondary | ICD-10-CM | POA: Diagnosis not present

## 2020-10-07 DIAGNOSIS — Z932 Ileostomy status: Secondary | ICD-10-CM | POA: Diagnosis not present

## 2020-10-07 DIAGNOSIS — N179 Acute kidney failure, unspecified: Secondary | ICD-10-CM | POA: Diagnosis not present

## 2020-10-07 DIAGNOSIS — E039 Hypothyroidism, unspecified: Secondary | ICD-10-CM | POA: Diagnosis not present

## 2020-10-07 DIAGNOSIS — E46 Unspecified protein-calorie malnutrition: Secondary | ICD-10-CM | POA: Diagnosis not present

## 2020-10-07 DIAGNOSIS — I2510 Atherosclerotic heart disease of native coronary artery without angina pectoris: Secondary | ICD-10-CM | POA: Diagnosis not present

## 2020-10-07 DIAGNOSIS — D649 Anemia, unspecified: Secondary | ICD-10-CM | POA: Diagnosis not present

## 2020-10-07 DIAGNOSIS — R002 Palpitations: Secondary | ICD-10-CM | POA: Diagnosis not present

## 2020-10-10 DIAGNOSIS — L89613 Pressure ulcer of right heel, stage 3: Secondary | ICD-10-CM | POA: Diagnosis not present

## 2020-10-17 DIAGNOSIS — L89613 Pressure ulcer of right heel, stage 3: Secondary | ICD-10-CM | POA: Diagnosis not present

## 2020-10-25 DIAGNOSIS — J690 Pneumonitis due to inhalation of food and vomit: Secondary | ICD-10-CM | POA: Diagnosis not present

## 2020-10-25 DIAGNOSIS — E785 Hyperlipidemia, unspecified: Secondary | ICD-10-CM | POA: Diagnosis not present

## 2020-10-25 DIAGNOSIS — I69318 Other symptoms and signs involving cognitive functions following cerebral infarction: Secondary | ICD-10-CM | POA: Diagnosis not present

## 2020-10-25 DIAGNOSIS — I129 Hypertensive chronic kidney disease with stage 1 through stage 4 chronic kidney disease, or unspecified chronic kidney disease: Secondary | ICD-10-CM | POA: Diagnosis not present

## 2020-10-25 DIAGNOSIS — E43 Unspecified severe protein-calorie malnutrition: Secondary | ICD-10-CM | POA: Diagnosis not present

## 2020-10-25 DIAGNOSIS — I69322 Dysarthria following cerebral infarction: Secondary | ICD-10-CM | POA: Diagnosis not present

## 2020-10-25 DIAGNOSIS — F0390 Unspecified dementia without behavioral disturbance: Secondary | ICD-10-CM | POA: Diagnosis not present

## 2020-10-25 DIAGNOSIS — R634 Abnormal weight loss: Secondary | ICD-10-CM | POA: Diagnosis not present

## 2020-10-25 DIAGNOSIS — R296 Repeated falls: Secondary | ICD-10-CM | POA: Diagnosis not present

## 2020-10-25 DIAGNOSIS — I69391 Dysphagia following cerebral infarction: Secondary | ICD-10-CM | POA: Diagnosis not present

## 2020-10-25 DIAGNOSIS — I69354 Hemiplegia and hemiparesis following cerebral infarction affecting left non-dominant side: Secondary | ICD-10-CM | POA: Diagnosis not present

## 2020-10-25 DIAGNOSIS — N1831 Chronic kidney disease, stage 3a: Secondary | ICD-10-CM | POA: Diagnosis not present

## 2020-10-25 DIAGNOSIS — Z6820 Body mass index (BMI) 20.0-20.9, adult: Secondary | ICD-10-CM | POA: Diagnosis not present

## 2020-10-25 DIAGNOSIS — G9340 Encephalopathy, unspecified: Secondary | ICD-10-CM | POA: Diagnosis not present

## 2020-10-26 DIAGNOSIS — J690 Pneumonitis due to inhalation of food and vomit: Secondary | ICD-10-CM | POA: Diagnosis not present

## 2020-10-26 DIAGNOSIS — G9340 Encephalopathy, unspecified: Secondary | ICD-10-CM | POA: Diagnosis not present

## 2020-10-26 DIAGNOSIS — I69318 Other symptoms and signs involving cognitive functions following cerebral infarction: Secondary | ICD-10-CM | POA: Diagnosis not present

## 2020-10-26 DIAGNOSIS — I69354 Hemiplegia and hemiparesis following cerebral infarction affecting left non-dominant side: Secondary | ICD-10-CM | POA: Diagnosis not present

## 2020-10-26 DIAGNOSIS — I69391 Dysphagia following cerebral infarction: Secondary | ICD-10-CM | POA: Diagnosis not present

## 2020-10-26 DIAGNOSIS — I69322 Dysarthria following cerebral infarction: Secondary | ICD-10-CM | POA: Diagnosis not present

## 2020-11-16 DEATH — deceased
